# Patient Record
Sex: Female | Born: 1960 | Race: White | Hispanic: No | Marital: Married | Smoking: Never smoker
Health system: Southern US, Community
[De-identification: ages and names within clinical notes are randomized; demographics above are authoritative.]

## PROBLEM LIST (undated history)

## (undated) DIAGNOSIS — Z923 Personal history of irradiation: Secondary | ICD-10-CM

## (undated) DIAGNOSIS — F32A Depression, unspecified: Secondary | ICD-10-CM

## (undated) DIAGNOSIS — Z9889 Other specified postprocedural states: Secondary | ICD-10-CM

## (undated) DIAGNOSIS — F419 Anxiety disorder, unspecified: Secondary | ICD-10-CM

## (undated) DIAGNOSIS — F329 Major depressive disorder, single episode, unspecified: Secondary | ICD-10-CM

## (undated) DIAGNOSIS — E7800 Pure hypercholesterolemia, unspecified: Secondary | ICD-10-CM

## (undated) DIAGNOSIS — T7840XA Allergy, unspecified, initial encounter: Secondary | ICD-10-CM

## (undated) DIAGNOSIS — I10 Essential (primary) hypertension: Secondary | ICD-10-CM

## (undated) HISTORY — DX: Pure hypercholesterolemia, unspecified: E78.00

## (undated) HISTORY — PX: BREAST LUMPECTOMY: SHX2

## (undated) HISTORY — DX: Allergy, unspecified, initial encounter: T78.40XA

## (undated) HISTORY — PX: REPAIR PERONEAL TENDONS ANKLE: SUR1201

## (undated) HISTORY — DX: Anxiety disorder, unspecified: F41.9

---

## 1999-06-29 ENCOUNTER — Other Ambulatory Visit: Admission: RE | Admit: 1999-06-29 | Discharge: 1999-06-29 | Payer: Self-pay | Admitting: Obstetrics and Gynecology

## 2000-05-28 HISTORY — PX: BREAST BIOPSY: SHX20

## 2000-05-28 HISTORY — PX: BREAST SURGERY: SHX581

## 2000-07-22 ENCOUNTER — Other Ambulatory Visit: Admission: RE | Admit: 2000-07-22 | Discharge: 2000-07-22 | Payer: Self-pay | Admitting: Obstetrics and Gynecology

## 2000-08-14 ENCOUNTER — Encounter: Admission: RE | Admit: 2000-08-14 | Discharge: 2000-08-14 | Payer: Self-pay | Admitting: General Surgery

## 2000-08-14 ENCOUNTER — Encounter: Payer: Self-pay | Admitting: General Surgery

## 2000-08-28 ENCOUNTER — Encounter: Payer: Self-pay | Admitting: General Surgery

## 2000-08-28 ENCOUNTER — Encounter: Admission: RE | Admit: 2000-08-28 | Discharge: 2000-08-28 | Payer: Self-pay | Admitting: General Surgery

## 2000-09-06 ENCOUNTER — Encounter (INDEPENDENT_AMBULATORY_CARE_PROVIDER_SITE_OTHER): Payer: Self-pay | Admitting: Specialist

## 2000-09-06 ENCOUNTER — Ambulatory Visit (HOSPITAL_COMMUNITY): Admission: RE | Admit: 2000-09-06 | Discharge: 2000-09-06 | Payer: Self-pay | Admitting: General Surgery

## 2000-12-21 ENCOUNTER — Encounter: Admission: RE | Admit: 2000-12-21 | Discharge: 2000-12-21 | Payer: Self-pay | Admitting: Podiatry

## 2000-12-27 ENCOUNTER — Observation Stay (HOSPITAL_COMMUNITY): Admission: RE | Admit: 2000-12-27 | Discharge: 2000-12-28 | Payer: Self-pay | Admitting: Specialist

## 2001-07-29 ENCOUNTER — Other Ambulatory Visit: Admission: RE | Admit: 2001-07-29 | Discharge: 2001-07-29 | Payer: Self-pay | Admitting: Obstetrics and Gynecology

## 2002-08-03 ENCOUNTER — Other Ambulatory Visit: Admission: RE | Admit: 2002-08-03 | Discharge: 2002-08-03 | Payer: Self-pay | Admitting: Obstetrics and Gynecology

## 2003-09-28 ENCOUNTER — Other Ambulatory Visit: Admission: RE | Admit: 2003-09-28 | Discharge: 2003-09-28 | Payer: Self-pay | Admitting: Internal Medicine

## 2004-10-19 ENCOUNTER — Other Ambulatory Visit: Admission: RE | Admit: 2004-10-19 | Discharge: 2004-10-19 | Payer: Self-pay | Admitting: Internal Medicine

## 2004-12-18 ENCOUNTER — Other Ambulatory Visit: Admission: RE | Admit: 2004-12-18 | Discharge: 2004-12-18 | Payer: Self-pay | Admitting: Obstetrics and Gynecology

## 2005-12-28 ENCOUNTER — Ambulatory Visit (HOSPITAL_COMMUNITY): Admission: RE | Admit: 2005-12-28 | Discharge: 2005-12-29 | Payer: Self-pay | Admitting: Specialist

## 2008-04-15 ENCOUNTER — Inpatient Hospital Stay (HOSPITAL_COMMUNITY): Admission: RE | Admit: 2008-04-15 | Discharge: 2008-04-17 | Payer: Self-pay | Admitting: Obstetrics and Gynecology

## 2008-04-15 ENCOUNTER — Encounter (INDEPENDENT_AMBULATORY_CARE_PROVIDER_SITE_OTHER): Payer: Self-pay | Admitting: Obstetrics and Gynecology

## 2009-07-07 ENCOUNTER — Ambulatory Visit: Payer: Self-pay | Admitting: Internal Medicine

## 2009-08-05 ENCOUNTER — Ambulatory Visit: Payer: Self-pay | Admitting: Internal Medicine

## 2009-08-17 ENCOUNTER — Ambulatory Visit: Payer: Self-pay | Admitting: Internal Medicine

## 2009-08-24 ENCOUNTER — Ambulatory Visit: Payer: Self-pay | Admitting: Internal Medicine

## 2010-03-28 HISTORY — PX: ABDOMINAL HYSTERECTOMY: SHX81

## 2010-10-10 NOTE — H&P (Signed)
NAME:  Terri Montes, Terri Montes                 ACCOUNT NO.:  [id number]   MEDICAL RECORD NO.:  [id number]          PATIENT TYPE:  AMB   LOCATION:  SDC                           FACILITY:  [facility]   PHYSICIAN:  Michelle L. Grewal, M.D.DATE OF BIRTH:  08-17-1960   DATE OF ADMISSION:  DATE OF DISCHARGE:                              HISTORY & PHYSICAL   DATE OF SURGERY:  April 15, 2008, at [facility] at 7:30 in the  morning.   HISTORY OF PRESENT ILLNESS:  This is a 50 year old patient, LMP on  March 17, 2008, presents for TAH.  She has been on the Yasmin for some  time.  For the past 3-4 months, she has had horrible periods.  She wakes  up at night soaking through her clothes.  She is passing chunks of  tissue.  She  floods through her sanitary products, and she has ruined  many clothes.  She feels extremely fatigued.  She wants definitive  treatment of this, though she has been on birth control for some times.  Ultrasound was performed and showed a 80-mm fibroid abutting endometrial  stripe.  It appears to be completely intramural and submucosal, and it  goes all the way through the wall of the uterus.  On exam, her cervix is  deep in the vagina and uterus is enlarged.   MEDICAL HISTORY:  She has the history of headaches.  History of  surgeries, C-section in 1990 and vaginal delivery in 1993.   MEDICATIONS:  Yasmin.   ALLERGIES:  She has no known allergies.   FAMILY HISTORY:  Unremarkable.   SOCIAL HISTORY:  She denies any tobacco or alcohol.   REVIEW OF SYSTEMS:  Unremarkable.   PHYSICAL EXAMINATION:  VITAL SIGNS:  Height 5 feet and 5 inches, weight  218.4, and BP 136/72.  LUNGS:  Clear to auscultation bilaterally.  CARDIAC:  Regular rate and rhythm.  BREASTS:  Soft, nontender, and no masses.  PELVIC:  External genitalia.  Vulva and vagina appeared normal.  Cervix  is deep in the vagina.  Uterus is slightly enlarged.   IMPRESSION:  Menorrhagia and fibroids.   PLAN:  We will  proceed with TAH and possible BSO.  Risk of procedure  discussed with the patient which include risk of anesthesia, risk of  infection, risk of blood clot, and risk of injuries in surrounding  organs.  Postoperative care was discussed with the patient.  We will  proceed with the surgery.      Michelle L. Vincente Poli, M.D.  Electronically Signed     MLG/MEDQ  D:  04/09/2008  T:  04/10/2008  Job:  [id number]

## 2010-10-10 NOTE — Op Note (Signed)
NAME:  Terri Montes, Terri Montes                 ACCOUNT NO.:  [id number]   MEDICAL RECORD NO.:  [id number]          PATIENT TYPE:  AMB   LOCATION:  SDC                           FACILITY:  [facility]   PHYSICIAN:  Michelle L. Grewal, M.D.DATE OF BIRTH:  1961/03/14   DATE OF PROCEDURE:  04/15/2008  DATE OF DISCHARGE:                               OPERATIVE REPORT   PREOPERATIVE DIAGNOSIS:  Symptomatic fibroids and menorrhagia.   POSTOPERATIVE DIAGNOSIS:  Symptomatic fibroids and menorrhagia.   PROCEDURE:  Total abdominal hysterectomy.   SURGEON:  Michelle L. Vincente Poli, M.D.   ASSISTANT:  Zelphia Cairo, M.D.   ANESTHESIA:  General.   FINDINGS:  Multiple fibroids.   PROCEDURE:  Patient taken to the operating room where she was intubated  without difficulty.  She is prepped and draped in usual sterile fashion.  A Foley catheter was inserted and draining clear urine.  A low  transverse incision was made at the area of previous surgical scar, was  carried down to the fascia, fascia scored in the midline, extended  laterally.  The rectus muscles were separated in midline.  The  peritoneum was entered bluntly.  The peritoneal incision was then  stretched.  The self-retaining retractors was placed in the abdominal  cavity.  Large and small bowel were placed in the upper abdomen with  laparotomy pads.  The uterus was identified.  The ovaries appeared  normal.  The uterus was enlarged and had multiple fibroids.  Kelly  clamps were used and placed across the triple pedicle on either side and  the specimen was elevated.  The round ligament was identified on the  right side.  It was suture-ligated using 0 Vicryl suture and was  cauterized and then the anterior and posterior leaves of the broad  ligament were developed.  The triple pedicle was identified and  avascular window was created beneath it and a curved Heaney clamp was  placed across the triple pedicle.  It was suture ligated using 0 Vicryl  suture  and a free tie of 0 Vicryl suture.  This was done identically on  the left side.  The bladder flap was created without any difficulty and  the uterine artery was identified on the right side.  A curved Heaney  clamp was placed across the uterine artery at the level of the internal  os.  This was done on the right side and then on the left side and each  pedicle was suture ligated using 0 Vicryl suture.  We then were able to  elevate the uterus even more and then developed the bladder flap even  more using Metzenbaum scissors.  The straight Heaney clamps were placed  just snug beside the cervix, initially on the right and then on the  left.  Each pedicle was clamped, suture ligated using 0 Vicryl suture.  Once we reached the level of the external os, curved Heaney clamps were  placed just beneath the external os, staying snug beside the cervix and  the specimen was removed. The angle stitches were placed using 0 Vicryl  suture.  The  remainder of the cuff was closed using three figure-of-  eights using 0 Vicryl suture.  Irrigation was performed.  No bleeding  was noted.  Suture was cut and the retractor and the laparotomy pads  were removed from the abdominal cavity.  The peritoneum was closed using  0 Vicryl running stitch.  The rectus muscles were reapproximated using  the same 0 Vicryl.  The fascia was closed using 0 Vicryl in a running  stitch.  After irrigation of subcutaneous layer,  the skin was closed with a 3-0 Vicryl subcuticular and the skin was  closed with Dermabond skin adhesive.  All sponge, lap and instrument  counts were correct x2.  The patient went to recovery room, extubated in  stable condition.  She will go up to the third floor for routine postop  care and stay for approximately two nights.      Michelle L. Vincente Poli, M.D.  Electronically Signed     MLG/MEDQ  D:  04/15/2008  T:  04/15/2008  Job:  [id number]

## 2010-10-13 NOTE — Discharge Summary (Signed)
Terri Montes, Terri Montes                 ACCOUNT NO.:  [id number]   MEDICAL RECORD NO.:  [id number]          PATIENT TYPE:  INP   LOCATION:  9309                          FACILITY:  [facility]   PHYSICIAN:  Michelle L. Grewal, M.D.DATE OF BIRTH:  12/04/1960   DATE OF ADMISSION:  04/15/2008  DATE OF DISCHARGE:  04/17/2008                               DISCHARGE SUMMARY   HOSPITAL COURSE:  The patient is a 51 year old patient who presents  today for a TAH.  She underwent surgery, EBL at the time of surgery was  200 mL.  She had a complicated postop course.  By postop day #2, she was  ambulating, tolerating regular diet, remained afebrile with stable vital  signs, and had good pain control.  She was discharged home in good  condition on postop day #2.  She was given ibuprofen 600 mg every 6  hours as needed for pain and Percocet 1-2 every 4-7 hours as needed for  pain.  She was advised to call if she has any temperature greater than  100.5, redness or drainage from incision site, severe abdominal pain,  nausea, or vomiting.  She was advised to she could shower daily, but she  could have no baths for 6 weeks and no intercourse for 6 weeks.  She was  advised no driving for 1-2 weeks.  She will follow up in the office in 1  week.      Michelle L. Vincente Poli, M.D.  Electronically Signed     MLG/MEDQ  D:  05/13/2008  T:  05/13/2008  Job:  [id number]

## 2010-10-13 NOTE — Op Note (Signed)
NAMECHASMINE, LENDER                 ACCOUNT NO.:  [id number]   MEDICAL RECORD NO.:  [id number]          PATIENT TYPE:  AMB   LOCATION:  DAY                          [facility]ACILITY:  [facility]   PHYSICIAN:  Jene Every, M.D.    DATE OF BIRTH:  12/12/1960   DATE OF PROCEDURE:  12/28/2005  DATE OF DISCHARGE:                                 OPERATIVE REPORT   PREOPERATIVE DIAGNOSIS:  Peroneal tendon tear, synovitis.   POSTOPERATIVE DIAGNOSIS:  Peroneal tendon tear, synovitis.   PROCEDURE PERFORMED:  1. Repair of peroneal brevis tendon, right ankle.  2. Incision and drainage and synovectomy of peroneal tendon sheath.  3. Repair of retinaculum, peroneal tendon.   ANESTHESIA:  General.   ASSISTANT:  Roma Schanz, P.A.   BRIEF HISTORY/INDICATIONS:  This is a 50 year old female with refractory  ankle pain and swelling.  MRI indicating a split tear of the peroneus  brevis.  She was indicated for a repair and debridement.  She had been  refractory to a couple of steroid injections.  The risks and benefits have  been discussed, including bleeding, infection, damage to vascular  structures, re-tear, persistent pain, etc.   TECHNIQUE:  With the patient in the supine position after the induction of  adequate general anesthesia and 1 gm of Kefzol, the right lower extremity  was prepped and draped and exsanguinated in the usual sterile fashion.  Thigh tourniquet inflated to 300 mmHg.  An incision was made over the  posterior aspect of the fibula, through the skin only.  The subcutaneous  tissue was bluntly dissected.  Self-retaining retractors were placed.  Identified the retinaculum behind the fibula.  There was extensive and  exuberant synovitis and clear synovial fluid that was evacuated and sent for  Gram's stain which was negative.  We protected the neurovascular structures  at all times.  Bipolar electrocautery was utilized to achieve hemostasis.  We divided the retinaculum approximately 2  mm from its bony insertion on the  fibula, opened it, and examined the canal.  There was hypertrophic  synovitis, which was debrided and irrigated.  A longitudinal split tear was  noted in the peroneus brevis tendon approximately 2 cm in length.  This was  repaired with a 6-0 nylon running suture and simple sutures on the top  __________ for good closure and repair.  I then irrigated it.  Felt that to  repair the retinaculum and to make this repair secure, we used two  biosorbable suture anchors into the fibula with FiberWire attached to them.  We drilled a hole in the fibula and then repaired pants-over-vest the  retinaculum with two of these suture anchors with excellent repair, and this  was over-sewed with 0 Vicryl simple sutures.  The wound was then copiously  irrigated.  I then repaired the subcutaneous  tissue with 2-0 Vicryl simple sutures.  The skin was reapproximated with 3-0  subcuticular Prolene.  The wound reinforced with Steri-Strips.  Sterile  dressing applied.  The tourniquet was deflated once adequate vascularization  of the lower extremity was appreciated.  The patient tolerated the  procedure  well.  No complications.      Jene Every, M.D.  Electronically Signed     JB/MEDQ  D:  12/28/2005  T:  12/28/2005  Job:  [id number]

## 2010-10-13 NOTE — Op Note (Signed)
[facility]  Patient:    Terri Montes, Terri Montes                        MRN: [id number] Proc. Date: 12/27/00 Adm. Date:  [id number] Disc. Date: [id number] Attending:  Pierce Crane                           Operative Report  PREOPERATIVE DIAGNOSES:  ______ tenosynovitis peroneal tendon sheath, tear of the peroneal tendon, draining sinus wound.  POSTOPERATIVE DIAGNOSES:  ______ tenosynovitis peroneal tendon sheath, tear of the peroneal tendon, draining sinus wound, contusion with fibrosis around the sural nerve.  PROCEDURE PERFORMED:  Excision of the sinus tract, left ankle.  Epineurolysis of the sural nerve, open I&D of the peroneal tendon sheath, irrigation and debridement, intraoperative cultures and Gram stain of periperoneal brevis tendon.  BRIEF HISTORY AND INDICATION:  A 50 year old who received a laceration of the lateral aspect of the ankle, bled for a few days and became infected with purulent drainage.  Placed on Keflex and improved.  MRI indicated tear of the peroneal tendon, significant fluid around the peroneal tendon.  She had a persistent draining sinus with erythema.  Operative intervention was indicated for open I&D, debridement, possible repair of the peroneal tendon.  The risks and benefits discussed including bleeding, infection, damage to vascular structures, no change in symptoms, fibrosis, persistent symptoms.  DESCRIPTION OF PROCEDURE:  The patient in the supine position.  After adequate general anesthesia, 1 gram of Kefzol, she was placed in the right lateral decubitus position.  Tourniquet inflated to 350 mmHg.  No exsanguination.  A curvilinear incision was based over the lateral aspect of the ankle in the inframalleolar region, through the skin only.  The subcutaneous tissue was bluntly dissected.  The small eschar with surrounding erythema and sinus tract was excised down to the peroneal tendon sheath.  The sural nerve  was identified and found to be erythematous, edematous, and scar tissue surrounding this.  An epineurolysis was performed.  The sural nerve was preserved.  The peroneal tendon sheath was partially ruptured.  The retinaculum over the peroneal tendon sheath was then divided and brown, serous fluid was then evacuated from the peroneal tendon sheath.  This area was copiously irrigated, and hypertrophic synovium was slightly debrided.  The majority of the synovium was left intact.  Next, the peroneus brevis tendon was inspected and evaluated, and there was a longitudinal tear about 1.5 cm in length.  Narrow and full thickness longitudinally.  It was repaired with 2-0 Vicryl interrupted figure-of-eight sutures.  The wound copiously irrigated. Allis placed on the tendon, distally and proximally.  It was continuous. Again, the wound copiously irrigated.  Drain was placed, and the peroneal tendon sheath brought out through a distal stab wound on the skin.  STAT intraoperative Gram stain indicated no evidence of organisms or white blood cells.  The retinaculum was then repaired with #1 Vicryl interrupted figure-of-eight sutures.  The subcutaneous tissue was reapproximated with 2-0 Vicryl simple sutures.  The skin was reapproximated with 4-0 subcuticular Prolene and the wound reinforced with Steri-Strips.  Again, the sural nerve was placed in the fatty tissues.  The wound was dressed sterilely, secured with an ACE bandage.  Tourniquet was deflated, and there was adequate vascularization of the lower extremity appreciated.  The patient tolerated the procedure well without complications. DD:  12/27/00 TD:  12/30/00 Job: [id number]

## 2010-10-13 NOTE — Op Note (Signed)
[facility]  Patient:    Terri Montes, Terri Montes                        MRN: [id number] Proc. Date: 09/06/00 Adm. Date:  [id number] Attending:  Carson Myrtle CC:         Marcelle Overlie, M.D.   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  PROCEDURE:  Excision mass, left breast.  SURGEON:  Dr. Kendrick Ranch.  ANESTHESIA:  Local standby.  INDICATIONS FOR PROCEDURE:  Ms. Daddona has been seen and consulted in the office for a clear to bloody discharge in the left nipple. A ductogram revealed a probable papilloma and she is anxious and ready to proceed with surgical excision. She agrees and understands the procedure, its expected results and its potential complications.  DESCRIPTION OF PROCEDURE:  The patient is brought to the operating room, placed supine, IV started and sedation given. The left breast was prepped and draped in the usual fashion. The smallest of the tear duct probes was used and after gentle massage of the left breast, bloody discharge ensued from the center of the nipple complex. The tear duct probe was used to isolate this duct and it descended for about 2.5 cm into the breast. This was held in place and then local anesthesia was provided around the medial aspect of the nipple areolar complex. A curvilinear incision was made at the junction of the skin and areolar and I undermined the areolar, isolated the tear duct probe and its ductal structure and then sharply excised this from just under the nipple to deep in the breast. This was removed in four small segments, these were carefully laid out, positioned, placed on a towel, marked and sent to pathology. Bleeding was controlled with the cautery. Careful bimanual palpation revealed no other masses or areas of concern. The bloody discharge stopped. The tear duct probe was removed. The breast was  reapproximated with 3-0 Vicryl and the skin was closed with 4-0 monocryl. Counts were correct, Steri-Strips applied, she tolerated it well and was removed to the recovery room in good condition.  Written and verbal instructions were given including Vicodin #24, refill one. She is to call for results Monday and return to the office. DD:  09/06/00 TD:  09/06/00 Job: [id number]

## 2011-01-22 ENCOUNTER — Telehealth: Payer: Self-pay | Admitting: Internal Medicine

## 2011-01-22 DIAGNOSIS — Z8669 Personal history of other diseases of the nervous system and sense organs: Secondary | ICD-10-CM

## 2011-01-22 NOTE — Telephone Encounter (Signed)
Patient called this morning to say her husband took his life yesterday. One of her sons found him. Body was sent to medical examiner at [facility]. He had been under treatment for depression. Was very depressed about business situation. She asked if she had caused this to happen and I told her she was not at fault. She said he been going to [facility]. She says she has Xanax to take for anxiety. Says she has had a headache. Asking if she can take Excedrin and I told her it was fine to take Excedrin sparingly if it did not upset her stomach. Offered to see her if needed.

## 2011-02-27 LAB — CBC
HCT: 30 — ABNORMAL LOW
HCT: 38.2
Hemoglobin: 10.6 — ABNORMAL LOW
Hemoglobin: 13.3
MCHC: 34.7
MCHC: 35.5
MCV: 91.8
MCV: 92.8
Platelets: 231
Platelets: 291
RBC: 3.23 — ABNORMAL LOW
RBC: 4.16
RDW: 12.3
RDW: 12.3
WBC: 7.8
WBC: 9.5

## 2011-02-27 LAB — BASIC METABOLIC PANEL
BUN: 8
CO2: 23
Calcium: 8.6
Chloride: 97
Creatinine, Ser: 0.79
GFR calc Af Amer: >60
GFR calc non Af Amer: >60
Glucose, Bld: 153 — ABNORMAL HIGH
Potassium: 3.5
Sodium: 131 — ABNORMAL LOW

## 2011-10-31 ENCOUNTER — Other Ambulatory Visit: Payer: Self-pay | Admitting: Obstetrics and Gynecology

## 2012-07-15 ENCOUNTER — Ambulatory Visit (INDEPENDENT_AMBULATORY_CARE_PROVIDER_SITE_OTHER): Payer: 59 | Admitting: Internal Medicine

## 2012-07-15 ENCOUNTER — Encounter: Payer: Self-pay | Admitting: Internal Medicine

## 2012-07-15 VITALS — BP 158/98 | HR 96 | Temp 99.5°F | Ht 64.0 in | Wt 228.0 lb

## 2012-07-15 DIAGNOSIS — L0292 Furuncle, unspecified: Secondary | ICD-10-CM

## 2012-07-15 DIAGNOSIS — Z23 Encounter for immunization: Secondary | ICD-10-CM

## 2012-07-15 DIAGNOSIS — L02429 Furuncle of limb, unspecified: Secondary | ICD-10-CM

## 2012-07-15 DIAGNOSIS — L0293 Carbuncle, unspecified: Secondary | ICD-10-CM

## 2012-07-15 DIAGNOSIS — I10 Essential (primary) hypertension: Secondary | ICD-10-CM

## 2012-07-15 DIAGNOSIS — L02431 Carbuncle of right axilla: Secondary | ICD-10-CM

## 2012-07-15 MED ORDER — TETANUS-DIPHTH-ACELL PERTUSSIS 5-2.5-18.5 LF-MCG/0.5 IM SUSP: 0.5000 mL | Freq: Once | INTRAMUSCULAR | Status: DC

## 2012-07-15 NOTE — Patient Instructions (Addendum)
Appointment with Dr. Jamey Ripa 2:45 PM February 19. Begin doxycycline 100 mg twice daily for 10 days. Apply Bactroban ointment to nostrils at bedtime. Bathing instructions given. We have asked patient to have her son who is a paramedic check her BP at home, daily if possible and callus if it is elevated

## 2012-07-15 NOTE — Progress Notes (Signed)
  Subjective:    Patient ID: Terri Montes, female    DOB: 1960/09/14, 52 y.o.   MRN: [id number]  HPI 52 year old white female not seen since 2011 in today with carbuncle in right axilla onset a couple of days ago. Very painful and tender to touch. Says she had a similar lesion in her groin late fall 2013 which was treated by her GYN physician, Dr. Vincente Poli. She was told at that time she had a staph infection. She does not know if carbuncle in groin was cultured. There are no culture results in Epic from Fall 2013. She is afebrile. Taking Tylenol for pain with fairly good relief. She sensitive to pain medication.  She is a widow. 2 sons. Sons reside at home with her.  History of migraine headaches and hypertension.  Intolerant to Monistat, sulfa, Imitrex  C-section December 1995. Knee surgery 1987. 2 pregnancies no miscarriages.  Does not smoke or consume alcohol. 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 Two-year college degree.  Family history: Mother with history of hypertension and goiter    Review of Systems     Objective:   Physical Exam tender indurated erythematous lesion right axilla approximately 3 cm in diameter that is not draining        Assessment & Plan:  Carbuncle right axilla  History of previous carbuncle in groin-- Fall 2013  Suspect MRSA infection  Hypertension-blood pressure elevated today I think secondary to stress and pain. Will need followup.  Plan: Doxycycline 100 mg by mouth twice a day for 10 days. Purchase Hibiclens soap and begin bathing from neck down allowing Hibiclens to stay on 5 minutes before rinsing off for 3 months. This be followed by bathing with Dial soap with washcloth. Apply Bactroban ointment to nostrils hs.  Appointment with Dr. Jamey Ripa Wednesday, February 19 for incision and drainage.  Tetanus immunization update given.

## 2012-07-16 ENCOUNTER — Encounter (INDEPENDENT_AMBULATORY_CARE_PROVIDER_SITE_OTHER): Payer: Self-pay | Admitting: Surgery

## 2012-07-16 ENCOUNTER — Ambulatory Visit (INDEPENDENT_AMBULATORY_CARE_PROVIDER_SITE_OTHER): Payer: 59 | Admitting: Surgery

## 2012-07-16 VITALS — BP 158/86 | HR 101 | Temp 97.6°F | Ht 64.0 in | Wt 228.0 lb

## 2012-07-16 DIAGNOSIS — IMO0002 Reserved for concepts with insufficient information to code with codable children: Secondary | ICD-10-CM

## 2012-07-16 DIAGNOSIS — L02411 Cutaneous abscess of right axilla: Secondary | ICD-10-CM

## 2012-07-16 NOTE — Progress Notes (Signed)
CC:  Chief Complaint  Patient presents with  . Abscess    Right axilla    HPI: She comes to the urgent office ot the request of Dr Lenord Fellers for evaluation of a right axillary abscess. This has been developing for about three days, not better since starting doxy yesterday. Had a similar abscess in groin last year drained by Dr Vincente Poli and told it was staph.  PFSH _ In Epic and reviewed  EXAM:  VS; BP 158/86  Pulse 101  Temp(Src) 97.6 F (36.4 C) (Temporal)  Ht 5\' 4"  (1.626 m)  Wt 228 lb (103.42 kg)  BMI 39.12 kg/m2  SpO2 96% Gen: Alert, NAD Axilla: Fluctuant areas in anterior right axilla with erythema extending more medially. Appears superficial  IMP: Acute axillary abscess PLAN: I&. patient agrees.  Procedure Note: The area is anesthetized with 1% xylo with Epi. An I&D done sand a small amount of purulent material drained. Packed with Iodoform.  She will continue antibiotics, and see [place] in 10 days, but sooner if not better

## 2012-07-16 NOTE — Addendum Note (Signed)
Addended byLiliana Cline on: 07/16/2012 04:07 PM   Modules accepted: Orders

## 2012-07-16 NOTE — Patient Instructions (Addendum)
You may remove the dressing in two days and remove the pack. Then shower and keep a sterile dressing on.  Se me in about 10 days, sooner if this does not improve  MRSA Overview MRSA stands for methicillin-resistant Staphylococcus aureus. It is a type of bacteria that is resistant to some common antibiotics. It can cause infections in the skin and many other places in the body. Staphylococcus aureus, often called "staph," is a bacteria that normally lives on the skin or in the nose. Staph on the surface of the skin or in the nose does not cause problems. However, if the staph enters the body through a cut, wound, or break in the skin, an infection can happen. Up until recently, infections with the MRSA type of staph mainly occurred in hospitals and other healthcare settings. There are now increasing problems with MRSA infections in the community as well. Infections with MRSA may be very serious or even life-threatening. Most MRSA infections are acquired in one of two ways:  Healthcare-associated MRSA (HA-MRSA)  This can be acquired by people in any healthcare setting. MRSA can be a big problem for hospitalized people, people in nursing homes, people in rehabilitation facilities, people with weakened immune systems, dialysis patients, and those who have had surgery.  Community-associated MRSA (CA-MRSA)  Community spread of MRSA is becoming more common. It is known to spread in crowded settings, in jails and prisons, and in situations where there is close skin-to-skin contact, such as during sporting events or in locker rooms. MRSA can be spread through shared items, such as children's toys, razors, towels, or sports equipment. CAUSES  All staph, including MRSA, are normally harmless unless they enter the body through a scratch, cut, or wound, such as with surgery. All staph, including MRSA, can be spread from person-to-person by touching contaminated objects or through direct contact. SPECIAL  GROUPS MRSA can present problems for special groups of people. Some of these groups include:  Breastfeeding women.  The most common problem is MRSA infection of the breast (mastitis). There is evidence that MRSA can be passed to an infant from infected breast milk. Your caregiver may recommend that you stop breastfeeding until the mastitis is under control.  If you are breastfeeding and have a MRSA infection in a place other than the breast, you may usually continue breastfeeding while under treatment. If taking antibiotics, ask your caregiver if it is safe to continue breastfeeding while taking your prescribed medicines.  Neonates (babies from birth to 84 month old) and infants (babies from 74 month to 71 year old).  There is evidence that MRSA can be passed to a newborn at birth if the mother has MRSA on the skin, in or around the birth canal, or an infection in the uterus, cervix, or vagina. MRSA infection can have the same appearance as a normal newborn or infant rash or several other skin infections. This can make it hard to diagnose MRSA.  Immune compromised people.  If you have an immune system problem, you may have a higher chance of developing a MRSA infection.  People after any type of surgery.  Staph in general, including MRSA, is the most common cause of infections occurring at the site of recent surgery.  People on long-term steroid medicines.  These kinds of medicines can lower your resistance to infection. This can increase your chance of getting MRSA.  People who have had frequent hospitalizations, live in nursing homes or other residential care facilities, have venous or urinary  catheters, or have taken multiple courses of antibiotic therapy for any reason. DIAGNOSIS  Diagnosis of MRSA is done by cultures of fluid samples that may come from:  Swabs taken from cuts or wounds in infected areas.  Nasal swabs.  Saliva or deep cough specimens from the lungs  (sputum).  Urine.  Blood. Many people are "colonized" with MRSA but have no signs of infection. This means that people carry the MRSA germ on their skin or in their nose and may never develop MRSA infection.  TREATMENT  Treatment varies and is based on how serious, how deep, or how extensive the infection is. For example:  Some skin infections, such as a small boil or abscess, may be treated by draining yellowish-white fluid (pus) from the site of the infection.  Deeper or more widespread soft tissue infections are usually treated with surgery to drain pus and with antibiotic medicine given by vein or by mouth. This may be recommended even if you are pregnant.  Serious infections may require a hospital stay. If antibiotics are given, they may be needed for several weeks. PREVENTION  Because many people are colonized with staph, including MRSA, preventing the spread of the bacteria from person-to-person is most important. The best way to prevent the spread of bacteria and other germs is through proper hand washing or by using alcohol-based hand disinfectants. The following are other ways to help prevent MRSA infection within the hospital and community settings.   Healthcare settings:  Strict hand washing or hand disinfection procedures need to be followed before and after touching every patient.  Patients infected with MRSA are placed in isolation to prevent the spread of the bacteria.  Healthcare workers need to wear disposable gowns and gloves when touching or caring for patients infected with MRSA. Visitors may also be asked to wear a gown and gloves.  Hospital surfaces need to be disinfected frequently.  Community settings:  NIKE frequently with soap and water for at least 15 seconds. Otherwise, use alcohol-based hand disinfectants when soap and water is not available.  Make sure people who live with you wash their hands often, too.  Do not share personal items. For  example, avoid sharing razors and other personal hygiene items, towels, clothing, and athletic equipment.  Wash and dry your clothes and bedding at the warmest temperatures recommended on the labels.  Keep wounds covered. Pus from infected sores may contain MRSA and other bacteria. Keep cuts and abrasions clean and covered with germ-free (sterile), dry bandages until they are healed.  If you have a wound that appears infected, ask your caregiver if a culture for MRSA and other bacteria should be done.  If you are breastfeeding, talk to your caregiver about MRSA. You may be asked to temporarily stop breastfeeding. HOME CARE INSTRUCTIONS   Take your antibiotics as directed. Finish them even if you start to feel better.  Avoid close contact with those around you as much as possible. Do not use towels, razors, toothbrushes, bedding, or other items that will be used by others.  To fight the infection, follow your caregiver's instructions for wound care. Wash your hands before and after changing your bandages.  If you have an intravascular device, such as a catheter, make sure you know how to care for it.  Be sure to tell any healthcare providers that you have MRSA so they are aware of your infection. SEEK IMMEDIATE MEDICAL CARE IF:   The infection appears to be getting worse. Signs  include:  Increased warmth, redness, or tenderness around the wound site.  A red line that extends from the infection site.  A dark color in the area around the infection.  Wound drainage that is tan, yellow, or green.  A bad smell coming from the wound.  You feel sick to your stomach (nauseous) and throw up (vomit) or cannot keep medicine down.  You have a fever.  Your baby is older than 3 months with a rectal temperature of 102 F (38.9 C) or higher.  Your baby is 39 months old or younger with a rectal temperature of 100.4 F (38 C) or higher.  You have difficulty breathing. MAKE SURE YOU:    Understand these instructions.  Will watch your condition.  Will get help right away if you are not doing well or get worse. Document Released: 05/14/2005 Document Revised: 08/06/2011 Document Reviewed: 08/16/2010 [facility] Patient Information 2013 [place], [place].

## 2012-07-18 ENCOUNTER — Telehealth (INDEPENDENT_AMBULATORY_CARE_PROVIDER_SITE_OTHER): Payer: Self-pay | Admitting: General Surgery

## 2012-07-18 NOTE — Telephone Encounter (Signed)
Pt called to go over removing the packing from her wound and showering.  She has abscess I&D in Urgent office on 07/16/12, packed with iodaform gauze.  Her PCP has her showering with Hibiclens over her whole body, then using Dial soap (prevention/ treatment for MRSA.)  She is taking Doxycycline and using nasal swab ointment.  Drainage is still purulent and has foul odor, but slowly improving.  Reassured pt this is all expected and with history of MRSA, this it the preferred treatment.  Appt made with Urgent Office to recheck at 10 day mark, as Dr. Jamey Ripa is DOW that week and not available.  Pt will cancel appt if wound is healing well.

## 2012-07-19 LAB — WOUND CULTURE

## 2012-07-24 ENCOUNTER — Ambulatory Visit (INDEPENDENT_AMBULATORY_CARE_PROVIDER_SITE_OTHER): Payer: 59 | Admitting: Internal Medicine

## 2012-07-24 ENCOUNTER — Encounter: Payer: Self-pay | Admitting: Internal Medicine

## 2012-07-24 ENCOUNTER — Telehealth: Payer: Self-pay | Admitting: Internal Medicine

## 2012-07-24 VITALS — BP 178/110 | HR 84 | Temp 99.0°F | Wt 228.0 lb

## 2012-07-24 DIAGNOSIS — L02429 Furuncle of limb, unspecified: Secondary | ICD-10-CM

## 2012-07-24 DIAGNOSIS — I10 Essential (primary) hypertension: Secondary | ICD-10-CM

## 2012-07-24 DIAGNOSIS — L02439 Carbuncle of limb, unspecified: Secondary | ICD-10-CM

## 2012-07-24 DIAGNOSIS — R112 Nausea with vomiting, unspecified: Secondary | ICD-10-CM

## 2012-07-24 DIAGNOSIS — R319 Hematuria, unspecified: Secondary | ICD-10-CM

## 2012-07-24 LAB — T4, FREE: Free T4: 1.03 ng/dL (ref 0.80–1.80)

## 2012-07-24 LAB — TSH: TSH: 1.432 u[IU]/mL (ref 0.350–4.500)

## 2012-07-24 LAB — COMPREHENSIVE METABOLIC PANEL
Alkaline Phosphatase: 100 U/L (ref 39–117)
CO2: 29 mEq/L (ref 19–32)
Creat: 0.74 mg/dL (ref 0.50–1.10)
Glucose, Bld: 80 mg/dL (ref 70–99)
Total Bilirubin: 0.5 mg/dL (ref 0.3–1.2)

## 2012-07-24 LAB — CBC WITH DIFFERENTIAL/PLATELET
Basophils Absolute: 0.1 10*3/uL (ref 0.0–0.1)
HCT: 42.3 % (ref 36.0–46.0)
Hemoglobin: 14.6 g/dL (ref 12.0–15.0)
Lymphocytes Relative: 26 % (ref 12–46)
Monocytes Absolute: 1 10*3/uL (ref 0.1–1.0)
Monocytes Relative: 9 % (ref 3–12)
Neutro Abs: 7.4 10*3/uL (ref 1.7–7.7)
WBC: 11.8 10*3/uL — ABNORMAL HIGH (ref 4.0–10.5)

## 2012-07-24 NOTE — Progress Notes (Signed)
  Subjective:    Patient ID: Terri Montes, female    DOB: December 23, 1960, 52 y.o.   MRN: [id number]  HPI Patient has prior history of hypertension but says what she lost weight blood pressure normalized and she discontinued diuretic. She was here February 18 with carbuncle right axilla. Was referred to general surgeon the following day for incision and drainage. Says it's now draining serosanguineous fluid. Culture grew staph aureus (not more so) sensitive to Keflex and doxycycline. She was treated with doxycycline for 10 days. Today had onset of nausea and vomiting this morning. Has not felt well. Has had a headache. No diarrhea. Has felt chilly but no shaking chills. Is about to finish ten-day course of doxycycline tomorrow. Has appointment see surgeon tomorrow.  Blood pressure was elevated on February 18 at 158/98. Patient was advised to keep track of blood pressure at home. One of her sons is a paramedic and she's been checking it. It's been persistently elevated since being seen here on February 18. She is concerned about it in tearful in the office today but is not sure why this has happened. There is a family history of hypertension in her mother. Used to be on Maxzide 75/50 for hypertension. Patient says migraines disappeared when she had hysterectomy. Maxzide was started in February 2011. At that time blood pressure was 150/98. Hysterectomy without oophorectomy was done November 2010.    Review of Systems     Objective:   Physical Exam Funduscopic exam shows the disks to be sharp and flat bilaterally. No hemorrhages. Pharynx and TMs are clear. Neck is supple without JVD thyromegaly or carotid bruits. Chest clear to auscultation. Cardiac exam: regular rate and rhythm normal S1 and S2. Extremities without edema. Neuro: no gross focal deficits on brief neurological exam.  Right axilla: shows no purulent drainage at this point in time and incision still present.        Assessment & Plan:   Hypertension-thought to be benign essential hypertension. Family history of hypertension in mother. Start Norvasc 5 mg daily and return in 4 days.  Anxiety-clearly worried about her health. Asking if she has bacterial endocarditis. Patient reassured.  Carbuncle right axilla- status post incision and drainage treated with doxycycline  Nausea-? Related to doxycycline versus viral syndrome. Prescribed Phenergan 25 mg tablets one every 4 hours as needed for nausea  Hematuria-urine dipstick shows occult blood. Urine sent for microscopic evaluation to [facility] lab  Plan: Patient had nonfasting lab work drawn today including CBC and complete metabolic panel free T4 and TSH . Started on Norvasc 5 mg daily. Take first dose today upon arriving home. Start taking Norvasc every morning starting tomorrow. Return on Monday, March 3 for followup.  Time spent with patient 30 minutes

## 2012-07-24 NOTE — Telephone Encounter (Signed)
Patient given appt for today, 2/27 @ 1600.

## 2012-07-24 NOTE — Telephone Encounter (Signed)
I do not think BP is rising because of antibiotic. See her at 4pm today.

## 2012-07-24 NOTE — Patient Instructions (Addendum)
Start Norvasc 5 mg daily. Take Phenergan 25 mg every 4 hours as needed for nausea. Stop doxycycline. Return on Monday, March 3 .

## 2012-07-25 ENCOUNTER — Encounter (INDEPENDENT_AMBULATORY_CARE_PROVIDER_SITE_OTHER): Payer: 59 | Admitting: Surgery

## 2012-07-25 LAB — URINALYSIS, ROUTINE W REFLEX MICROSCOPIC
Leukocytes, UA: NEGATIVE
Nitrite: NEGATIVE
Specific Gravity, Urine: 1.016 (ref 1.005–1.030)
Urobilinogen, UA: 0.2 mg/dL (ref 0.0–1.0)
pH: 7 (ref 5.0–8.0)

## 2012-07-25 LAB — URINALYSIS, MICROSCOPIC ONLY

## 2012-07-26 LAB — URINE CULTURE
Colony Count: NO GROWTH
Organism ID, Bacteria: NO GROWTH

## 2012-07-28 ENCOUNTER — Encounter (INDEPENDENT_AMBULATORY_CARE_PROVIDER_SITE_OTHER): Payer: 59 | Admitting: General Surgery

## 2012-07-28 ENCOUNTER — Encounter: Payer: Self-pay | Admitting: Internal Medicine

## 2012-07-28 ENCOUNTER — Ambulatory Visit (INDEPENDENT_AMBULATORY_CARE_PROVIDER_SITE_OTHER): Payer: 59 | Admitting: Internal Medicine

## 2012-07-28 VITALS — BP 152/90 | HR 88 | Wt 226.0 lb

## 2012-07-28 DIAGNOSIS — L02439 Carbuncle of limb, unspecified: Secondary | ICD-10-CM

## 2012-07-28 DIAGNOSIS — F411 Generalized anxiety disorder: Secondary | ICD-10-CM

## 2012-07-28 DIAGNOSIS — I10 Essential (primary) hypertension: Secondary | ICD-10-CM

## 2012-07-28 DIAGNOSIS — L02429 Furuncle of limb, unspecified: Secondary | ICD-10-CM

## 2012-07-28 DIAGNOSIS — R609 Edema, unspecified: Secondary | ICD-10-CM

## 2012-07-29 NOTE — Progress Notes (Signed)
  Subjective:    Patient ID: Terri Montes, female    DOB: 06-Sep-1960, 52 y.o.   MRN: [id number]  HPI 52 year old white female started on amlodipine 5 mg recently. Blood pressure has improved but is still not within normal limits. Patient previously took Maxzide 25 several years ago for hypertension but then lost weight and came off that medication. Patient lost her husband to suicide. She still living in their home. She has 2 sons who still live at home. Husband's in-laws live close by. This relationship has been a bit of an issue. She sees in-laws as being inquisitive about her life. 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 Patient says she is owed money by father-in-law. She is in a relationship now with a new man and that is disturbing to the in-laws. She feels that they blame her for husband's death. Husband was depressed over family business. Patient says she barred money from her 401-K to keep his business going and loaned money to business by credit card. At one point she was afraid she would lose her home due to the family business being in debt. 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 She would like to move but doesn't feel she can do so at this time. Father-in-law is been over to her out buildings and has been taking some equipment out of the buildings.  Patient complaining of fluid retention. Unable to wear some shoes recently. This could be due to amlodipine although she was on a small dose.  Patient having to take Xanax more frequently recently due to stress but not to excess by any means.  Was to have followup appointment with surgeon on February 28. Unfortunately surgeon was called to an emergency and she was not able to see him. Carbuncle I&D area seems to be healing up in axilla without issue. Doxycycline as been discontinued because of nausea      Review of Systems     Objective:   Physical Exam chest clear to auscultation. Cardiac exam regular rate and rhythm. Extremities without pitting edema  Spent 30 minutes speaking with patient about all these issues at length. Still has significant grief reaction and guilt regarding husband's death      Assessment & Plan:  Fluid retention on amlodipine  Hypertension-improved but not at goal  Anxiety  Situational stress  Carbuncle with staph aureus (not MRSA)-resolving from axillary area  Plan: Add Maxzide 25 to amlodipine 5 mg daily and return in 10 days. Recommend psychologist for counseling. Refill Xanax 0.5 mg #90 one half to one by mouth tid a day when necessary anxiety. Previous prescription done by GYN physician Dr. Vincente Poli.

## 2012-07-29 NOTE — Patient Instructions (Addendum)
Add Maxide 25 to amlodipine 5 mg daily. Consider counseling with psychologist. Take Xanax sparingly. Return in 10 days

## 2012-08-11 ENCOUNTER — Ambulatory Visit: Payer: 59 | Admitting: Internal Medicine

## 2012-08-11 ENCOUNTER — Encounter: Payer: Self-pay | Admitting: Internal Medicine

## 2012-08-11 VITALS — BP 148/86 | HR 100 | Temp 99.1°F | Wt 226.0 lb

## 2012-08-11 DIAGNOSIS — F439 Reaction to severe stress, unspecified: Secondary | ICD-10-CM

## 2012-08-11 DIAGNOSIS — R609 Edema, unspecified: Secondary | ICD-10-CM

## 2012-08-11 DIAGNOSIS — F411 Generalized anxiety disorder: Secondary | ICD-10-CM

## 2012-08-11 DIAGNOSIS — I10 Essential (primary) hypertension: Secondary | ICD-10-CM

## 2012-08-16 DIAGNOSIS — F439 Reaction to severe stress, unspecified: Secondary | ICD-10-CM | POA: Insufficient documentation

## 2012-08-16 NOTE — Progress Notes (Signed)
  Subjective:    Patient ID: Terri Montes, female    DOB: November 11, 1960, 52 y.o.   MRN: [id number]  HPI Patient in today to followup on anxiety and hypertension. Last seen in 07/29/2012 after being started on amlodipine for hypertension. Was complaining of fluid retention and was started on Maxzide 25. Blood pressure has improved to 148/86 the umbilicus see further improvement. Says she and her sons had a family meeting and tried to work out some of their issues. Says this was of benefit. She is going to see counselor in the near future. Realizes it blood pressure is likely elevated because of significant situational stress.    Review of Systems     Objective:   Physical Exam spent 20 minutes talking with patient about these issues. Chest clear to auscultation. Cardiac exam regular rate and rhythm normal S1 and S2. Trace edema nonpitting. Affect is normal        Assessment & Plan:  Hypertension  Situational stress  Plan: Discontinue Maxzide 25. Change to Cozaar 50/12.5 daily. Continue amlodipine 5 mg daily. Return in 3 weeks. Will need b-met drawn then on losartan HCTZ

## 2012-08-16 NOTE — Patient Instructions (Addendum)
Discontinue Maxide 25. Take Cozaar 50/12.5 mg daily. Continue amlodipine 5 mg daily. Return in 3 weeks.

## 2012-08-18 ENCOUNTER — Other Ambulatory Visit: Payer: Self-pay | Admitting: Internal Medicine

## 2012-09-05 ENCOUNTER — Ambulatory Visit: Payer: Self-pay | Admitting: Internal Medicine

## 2012-09-11 ENCOUNTER — Ambulatory Visit (INDEPENDENT_AMBULATORY_CARE_PROVIDER_SITE_OTHER): Payer: 59 | Admitting: Internal Medicine

## 2012-09-11 ENCOUNTER — Encounter: Payer: Self-pay | Admitting: Internal Medicine

## 2012-09-11 VITALS — BP 124/86 | HR 92 | Temp 99.1°F | Wt 230.0 lb

## 2012-09-11 DIAGNOSIS — R609 Edema, unspecified: Secondary | ICD-10-CM

## 2012-09-11 DIAGNOSIS — I10 Essential (primary) hypertension: Secondary | ICD-10-CM

## 2012-09-11 LAB — BASIC METABOLIC PANEL
CO2: 30 mEq/L (ref 19–32)
Calcium: 10.2 mg/dL (ref 8.4–10.5)
Creat: 0.76 mg/dL (ref 0.50–1.10)
Sodium: 139 mEq/L (ref 135–145)

## 2012-09-15 ENCOUNTER — Other Ambulatory Visit: Payer: Self-pay | Admitting: Internal Medicine

## 2012-10-14 ENCOUNTER — Other Ambulatory Visit: Payer: Self-pay | Admitting: Internal Medicine

## 2012-11-21 ENCOUNTER — Telehealth: Payer: Self-pay | Admitting: Internal Medicine

## 2012-11-21 NOTE — Telephone Encounter (Signed)
Yes we can do this. Does she want Pap and pelvic only with next visit?

## 2012-11-25 NOTE — Telephone Encounter (Signed)
Spoke with patient.  She states it is time for her annual now.  She's scheduled for 6 mo f/u in October.  She instead wants or "needs" to be seen r/t feet swelling.  She is having it in both feet with residual swelling leftover in the morning when she gets up.  She has been on Losartan/HCTZ since late April and continues to take that.  Still the swelling persists.  Appointment given for 7/8 for this issue as well as med check on Pristiq.  Patient advised she'll talk with Dr. Lenord Fellers at that time about when to do the Pap/Pelvic.

## 2012-12-02 ENCOUNTER — Ambulatory Visit (INDEPENDENT_AMBULATORY_CARE_PROVIDER_SITE_OTHER): Payer: 59 | Admitting: Internal Medicine

## 2012-12-02 ENCOUNTER — Encounter: Payer: Self-pay | Admitting: Internal Medicine

## 2012-12-02 VITALS — BP 128/80 | Temp 99.3°F | Wt 226.0 lb

## 2012-12-02 DIAGNOSIS — I10 Essential (primary) hypertension: Secondary | ICD-10-CM

## 2012-12-02 DIAGNOSIS — R609 Edema, unspecified: Secondary | ICD-10-CM

## 2012-12-02 MED ORDER — DESVENLAFAXINE SUCCINATE ER 50 MG PO TB24: 50.0000 mg | ORAL_TABLET | Freq: Every day | ORAL | Status: DC

## 2012-12-07 ENCOUNTER — Encounter: Payer: Self-pay | Admitting: Internal Medicine

## 2012-12-07 NOTE — Progress Notes (Signed)
  Subjective:    Patient ID: Terri Montes, female    DOB: May 30, 1960, 52 y.o.   MRN: [id number]  HPI Blood pressure under good control on current regimen. She's only on amlodipine 5 mg daily. Feet and legs have been swelling quite a bit. Long-standing history of fluid retention. She is on losartan HCTZ in addition to amlodipine. Also wants to have Pap smear. However has had hysterectomy. Wants to get GYN care here rather than see GYN physician.    Review of Systems     Objective:   Physical Exam bimanual exam today is normal without masses. No Pap taken because of hysterectomy. She has 1+ pitting edema ankles and feet. Chest clear. Cardiac exam regular rate and rhythm normal S1 and S2. Neck is supple without JVD thyromegaly or carotid bruits.        Assessment & Plan:  Dependent edema  Hypertension  Normal bimanual exam  Plan: Lasix 20 mg tablets prescribed. Patient is to take 2 tablets daily for 5 days then one tablet daily. Discontinue losartan HCTZ. Instead, Take losartan 100 mg daily. Continue amlodipine 5 mg daily. Check b- met next 4 weeks on Lasix.

## 2012-12-07 NOTE — Patient Instructions (Addendum)
Stop losartan HCTZ. Take Lasix 20 mg 2 tablets daily for 5 days then one tablet daily. Take losartan 100 mg daily and amlodipine 5 mg daily. Return for b- met in the next 4 weeks

## 2013-01-06 ENCOUNTER — Other Ambulatory Visit: Payer: 59 | Admitting: Internal Medicine

## 2013-01-06 DIAGNOSIS — I10 Essential (primary) hypertension: Secondary | ICD-10-CM

## 2013-01-06 LAB — BASIC METABOLIC PANEL
CO2: 30 mEq/L (ref 19–32)
Chloride: 103 mEq/L (ref 96–112)
Creat: 0.84 mg/dL (ref 0.50–1.10)
Potassium: 3.8 mEq/L (ref 3.5–5.3)
Sodium: 140 mEq/L (ref 135–145)

## 2013-02-15 DIAGNOSIS — R609 Edema, unspecified: Secondary | ICD-10-CM | POA: Insufficient documentation

## 2013-02-15 NOTE — Patient Instructions (Addendum)
Increased losartan HCTZ to 100/25 daily. Call with progress report in 4 weeks

## 2013-02-15 NOTE — Progress Notes (Signed)
  Subjective:    Patient ID: Terri Montes, female    DOB: Aug 26, 1960, 52 y.o.   MRN: [id number]  HPI At last visit, Maxzide was discontinued patient was started on Hyzaar 50/12.5 mg daily in addition to amlodipine 5 mg daily for hypertension. She is here for followup of hypertension. Tolerating Hyzaar without any problems. Still with some edema. Brings in multiple blood pressure readings. Still showing some labile fluctuations. Stress seems to be better.    Review of Systems     Objective:   Physical Exam neck is supple without JVD. Chest clear to auscultation. Cardiac exam regular rate and rhythm. 1+ nonpitting edema Lorch strategies        Assessment & Plan:  Hypertension-blood pressure improving  Dependent edema-long-standing history. Diuretic needs to be increased  Plan: Increase Hyzaar to 100/25. Continue to monitor blood pressure and call with progress report in 4 weeks.

## 2013-02-26 ENCOUNTER — Other Ambulatory Visit: Payer: Self-pay | Admitting: Internal Medicine

## 2013-03-19 ENCOUNTER — Encounter: Payer: Self-pay | Admitting: Internal Medicine

## 2013-03-19 ENCOUNTER — Other Ambulatory Visit: Payer: Self-pay | Admitting: Internal Medicine

## 2013-03-19 ENCOUNTER — Ambulatory Visit (INDEPENDENT_AMBULATORY_CARE_PROVIDER_SITE_OTHER): Payer: 59 | Admitting: Internal Medicine

## 2013-03-19 DIAGNOSIS — I10 Essential (primary) hypertension: Secondary | ICD-10-CM

## 2013-03-19 DIAGNOSIS — H8110 Benign paroxysmal vertigo, unspecified ear: Secondary | ICD-10-CM

## 2013-03-19 DIAGNOSIS — R609 Edema, unspecified: Secondary | ICD-10-CM

## 2013-03-19 DIAGNOSIS — H6590 Unspecified nonsuppurative otitis media, unspecified ear: Secondary | ICD-10-CM

## 2013-03-19 MED ORDER — TORSEMIDE 20 MG PO TABS: ORAL_TABLET | ORAL | Status: DC

## 2013-03-19 MED ORDER — AZITHROMYCIN 250 MG PO TABS: ORAL_TABLET | ORAL | Status: DC

## 2013-03-19 NOTE — Patient Instructions (Signed)
Take Bonine for vertigo. Take Z-pak for URI if needed. Take Torsemide for leg swelling return in 2 weeks.

## 2013-03-25 ENCOUNTER — Other Ambulatory Visit: Payer: Self-pay | Admitting: Internal Medicine

## 2013-03-29 ENCOUNTER — Encounter: Payer: Self-pay | Admitting: Internal Medicine

## 2013-03-29 NOTE — Progress Notes (Addendum)
  Subjective:    Patient ID: Terri Montes, female    DOB: [birth year], 52 y.o.   MRN: [id number]  HPI  In today to followup on hypertension and dependent edema. Is now on Lasix, losartan 100 mg daily and amlodipine 5 mg daily. Just got back from trip to the beach with her boyfriend. Had a good time but feels a bit dizzy today. Notices dizziness with position change. Still having issues with dependent edema.    Review of Systems     Objective:   Physical Exam Skin is warm and dry. Nodes none. PERRLA. Funduscopic exam benign. No gross deficits on brief neurological exam. TMs are full bilaterally but not red. Pharynx is clear. Neck is supple. Chest clear to auscultation. Continues with 1+ pitting edema lower extremities. Cardiac exam regular rate and rhythm normal S1 and S2.       Assessment & Plan:  HTN-blood pressure well controlled on current regimen  Benign positional vertigo-likely related to otitis media  Dependent edema-discontinue amlodipine. Still having edema issues. Consider trying Bystolic 5 mg daily in addition to losartan if needed for blood pressure control. Switch from Lasix to Demadex 20-40 mg daily   Bilateral serous otitis media-treat with Zithromax Z-PAK.  Plan: May take Meclizine for vertigo if needed. Antibiotics for bilateral serous otitis media. Take Demadex 20-40 mg daily. Stop amlodipine. Return November 7 for followup and basic metabolic panel.

## 2013-04-03 ENCOUNTER — Encounter: Payer: Self-pay | Admitting: Internal Medicine

## 2013-04-03 ENCOUNTER — Ambulatory Visit (INDEPENDENT_AMBULATORY_CARE_PROVIDER_SITE_OTHER): Payer: 59 | Admitting: Internal Medicine

## 2013-04-03 VITALS — BP 128/74 | HR 80 | Temp 97.7°F | Ht 65.0 in | Wt 226.0 lb

## 2013-04-03 DIAGNOSIS — R609 Edema, unspecified: Secondary | ICD-10-CM

## 2013-04-03 DIAGNOSIS — I10 Essential (primary) hypertension: Secondary | ICD-10-CM

## 2013-04-03 DIAGNOSIS — Z23 Encounter for immunization: Secondary | ICD-10-CM

## 2013-04-03 LAB — BASIC METABOLIC PANEL
CO2: 29 mEq/L (ref 19–32)
Calcium: 9.1 mg/dL (ref 8.4–10.5)
Chloride: 102 mEq/L (ref 96–112)
Potassium: 3.5 mEq/L (ref 3.5–5.3)

## 2013-04-05 ENCOUNTER — Telehealth: Payer: Self-pay | Admitting: Internal Medicine

## 2013-04-05 MED ORDER — POTASSIUM CHLORIDE CRYS ER 20 MEQ PO TBCR: 20.0000 meq | EXTENDED_RELEASE_TABLET | Freq: Every day | ORAL | Status: DC

## 2013-04-05 NOTE — Progress Notes (Signed)
  Subjective:    Patient ID: Terri Montes, female    DOB: November 06, 1960, 52 y.o.   MRN: [id number]  HPI At last visit she was seen at several different issues. She had bilateral serous otitis media and vertigo. However she was actually here to followup on dependent edema. At that time on October 23, amlodipine was discontinued. Lasix was discontinued. She started on Demadex 20-40 mg daily for dependent edema and continued on losartan. She was treated with Zithromax Z-PAK for serous otitis media and meclizine for vertigo. Vertigo resolved. Ears feel okay. She's been taking Demadex 20 mg daily for dependent edema and has had significant improvement with that and stopping amlodipine. She's pleased with the result.    Review of Systems     Objective:   Physical Exam neck is supple without JVD thyromegaly or carotid bruits. Chest clear to auscultation. Cardiac exam regular rate and rhythm normal S1 and S2. No significant edema in the lower extremities at the present time.        Assessment & Plan:  Dependent edema-effectively treated with Demadex 20 mg daily after stopping amlodipine and Lasix.  Hypertension-stable on losartan  Plan: Return in 6 months for physical examination.

## 2013-04-05 NOTE — Addendum Note (Signed)
Addended by: Margaree Mackintosh on: 04/05/2013 02:08 PM   Modules accepted: Orders

## 2013-04-05 NOTE — Patient Instructions (Signed)
Continue Demadex and losartan. B-met done today. Return in 6 months.

## 2013-04-05 NOTE — Telephone Encounter (Signed)
Potassium checked on November 7 result is 3.5 on Demadex and losartan. Add K-Dur 20 meq daily and repeat  basicmetabolic panel in 3 weeks without office visit. Prescription called in to [facility]

## 2013-04-30 ENCOUNTER — Other Ambulatory Visit: Payer: 59 | Admitting: Internal Medicine

## 2013-05-04 ENCOUNTER — Encounter: Payer: Self-pay | Admitting: Internal Medicine

## 2013-05-04 ENCOUNTER — Ambulatory Visit (INDEPENDENT_AMBULATORY_CARE_PROVIDER_SITE_OTHER): Payer: 59 | Admitting: Internal Medicine

## 2013-05-04 VITALS — BP 126/86 | HR 76 | Temp 99.1°F | Ht 65.0 in | Wt 226.0 lb

## 2013-05-04 DIAGNOSIS — R609 Edema, unspecified: Secondary | ICD-10-CM

## 2013-05-04 DIAGNOSIS — K5900 Constipation, unspecified: Secondary | ICD-10-CM

## 2013-05-04 DIAGNOSIS — I10 Essential (primary) hypertension: Secondary | ICD-10-CM

## 2013-05-04 LAB — BASIC METABOLIC PANEL
BUN: 11 mg/dL (ref 6–23)
CO2: 25 mEq/L (ref 19–32)
Chloride: 105 mEq/L (ref 96–112)
Creat: 0.92 mg/dL (ref 0.50–1.10)
Sodium: 140 mEq/L (ref 135–145)

## 2013-05-04 NOTE — Patient Instructions (Addendum)
Continue same meds and return in 6 months. Continue potassium supplement in addition to other medications.

## 2013-06-26 ENCOUNTER — Telehealth: Payer: Self-pay | Admitting: Internal Medicine

## 2013-06-26 ENCOUNTER — Other Ambulatory Visit: Payer: Self-pay

## 2013-06-26 MED ORDER — DESVENLAFAXINE SUCCINATE ER 100 MG PO TB24: 100.0000 mg | ORAL_TABLET | Freq: Every day | ORAL | Status: DC

## 2013-06-26 NOTE — Telephone Encounter (Signed)
Please call in Pristiq 100 mg daily for stress at work increasing from 50 mg daily. Give several refills

## 2013-06-26 NOTE — Telephone Encounter (Signed)
Patient informed. 

## 2013-08-24 ENCOUNTER — Ambulatory Visit (INDEPENDENT_AMBULATORY_CARE_PROVIDER_SITE_OTHER): Payer: 59 | Admitting: Internal Medicine

## 2013-08-24 ENCOUNTER — Encounter: Payer: Self-pay | Admitting: Internal Medicine

## 2013-08-24 VITALS — BP 146/90 | Temp 98.7°F | Wt 217.0 lb

## 2013-08-24 DIAGNOSIS — H6590 Unspecified nonsuppurative otitis media, unspecified ear: Secondary | ICD-10-CM

## 2013-08-24 DIAGNOSIS — H6592 Unspecified nonsuppurative otitis media, left ear: Secondary | ICD-10-CM

## 2013-08-24 DIAGNOSIS — H6690 Otitis media, unspecified, unspecified ear: Secondary | ICD-10-CM

## 2013-08-24 DIAGNOSIS — H6691 Otitis media, unspecified, right ear: Secondary | ICD-10-CM

## 2013-08-24 DIAGNOSIS — H109 Unspecified conjunctivitis: Secondary | ICD-10-CM

## 2013-08-24 MED ORDER — OFLOXACIN 0.3 % OP SOLN: 1.0000 [drp] | Freq: Four times a day (QID) | OPHTHALMIC | Status: DC

## 2013-08-24 MED ORDER — AZITHROMYCIN 250 MG PO TABS: ORAL_TABLET | ORAL | Status: DC

## 2013-08-24 NOTE — Progress Notes (Signed)
   Subjective:    Patient ID: Terri Montes, female    DOB: 06-04-1960, 53 y.o.   MRN: [id number]  HPI Two day history of URI type symptoms. Has right ear pain and upper respiratory congestion. No sore throat. No documented fever. Has developed bilateral eye drainage. Does not wear contact lenses. Slight cough. Has malaise and fatigue. Slightly achy.    Review of Systems     Objective:   Physical Exam Bilateral conjunctivitis. Right TM is full and pink. Left TM is full but not red. Pharynx: slightly injected without exudate. Neck is supple without significant adenopathy. Chest clear to auscultation.        Assessment & Plan:  Bilateral conjunctivitis  Right otitis media  Left serous otitis media  Plan: Zithromax Z-Pak take 2 tabs day one followed by 1 tab days 2 through 5 with 1 refill. If not better in one week have prescription refilled. Ofloxacin ophthalmic drops 2 drops in each eye 4 times a day for 5 days. Patient says she does not need cough medication. Take ibuprofen or Tylenol for ear pain.

## 2013-08-24 NOTE — Patient Instructions (Signed)
Use ofloxacin drops in each eye 4 times daily x 5 days and take Z pak as directed.

## 2013-10-06 ENCOUNTER — Other Ambulatory Visit: Payer: 59 | Admitting: Internal Medicine

## 2013-10-06 DIAGNOSIS — Z13220 Encounter for screening for lipoid disorders: Secondary | ICD-10-CM

## 2013-10-06 DIAGNOSIS — Z130 Encounter for screening for diseases of the blood and blood-forming organs and certain disorders involving the immune mechanism: Secondary | ICD-10-CM

## 2013-10-06 DIAGNOSIS — Z1329 Encounter for screening for other suspected endocrine disorder: Secondary | ICD-10-CM

## 2013-10-06 DIAGNOSIS — I10 Essential (primary) hypertension: Secondary | ICD-10-CM

## 2013-10-06 DIAGNOSIS — Z13228 Encounter for screening for other metabolic disorders: Secondary | ICD-10-CM

## 2013-10-06 LAB — CBC WITH DIFFERENTIAL/PLATELET
BASOS ABS: 0 10*3/uL (ref 0.0–0.1)
BASOS PCT: 0 % (ref 0–1)
EOS ABS: 0.1 10*3/uL (ref 0.0–0.7)
EOS PCT: 1 % (ref 0–5)
HEMATOCRIT: 37.6 % (ref 36.0–46.0)
Hemoglobin: 12.7 g/dL (ref 12.0–15.0)
Lymphocytes Relative: 28 % (ref 12–46)
Lymphs Abs: 2 10*3/uL (ref 0.7–4.0)
MCH: 31.1 pg (ref 26.0–34.0)
MCHC: 33.8 g/dL (ref 30.0–36.0)
MCV: 92.2 fL (ref 78.0–100.0)
MONO ABS: 0.5 10*3/uL (ref 0.1–1.0)
Monocytes Relative: 7 % (ref 3–12)
Neutro Abs: 4.5 10*3/uL (ref 1.7–7.7)
Neutrophils Relative %: 64 % (ref 43–77)
Platelets: 299 10*3/uL (ref 150–400)
RBC: 4.08 MIL/uL (ref 3.87–5.11)
RDW: 12.8 % (ref 11.5–15.5)
WBC: 7 10*3/uL (ref 4.0–10.5)

## 2013-10-06 LAB — COMPREHENSIVE METABOLIC PANEL
ALK PHOS: 79 U/L (ref 39–117)
ALT: 14 U/L (ref 0–35)
AST: 17 U/L (ref 0–37)
Albumin: 4 g/dL (ref 3.5–5.2)
BILIRUBIN TOTAL: 0.5 mg/dL (ref 0.2–1.2)
BUN: 15 mg/dL (ref 6–23)
CO2: 29 mEq/L (ref 19–32)
CREATININE: 0.91 mg/dL (ref 0.50–1.10)
Calcium: 9.1 mg/dL (ref 8.4–10.5)
Chloride: 105 mEq/L (ref 96–112)
Glucose, Bld: 81 mg/dL (ref 70–99)
Potassium: 4 mEq/L (ref 3.5–5.3)
Sodium: 141 mEq/L (ref 135–145)
Total Protein: 7 g/dL (ref 6.0–8.3)

## 2013-10-06 LAB — LIPID PANEL
CHOL/HDL RATIO: 3.2 ratio
Cholesterol: 173 mg/dL (ref 0–200)
HDL: 54 mg/dL (ref >39)
LDL Cholesterol: 94 mg/dL (ref 0–99)
Triglycerides: 124 mg/dL (ref <150)
VLDL: 25 mg/dL (ref 0–40)

## 2013-10-07 LAB — TSH: TSH: 0.815 u[IU]/mL (ref 0.350–4.500)

## 2013-10-07 LAB — VITAMIN D 25 HYDROXY (VIT D DEFICIENCY, FRACTURES): Vit D, 25-Hydroxy: 21 ng/mL — ABNORMAL LOW (ref 30–89)

## 2013-10-09 ENCOUNTER — Encounter: Payer: Self-pay | Admitting: Internal Medicine

## 2013-10-09 ENCOUNTER — Ambulatory Visit (INDEPENDENT_AMBULATORY_CARE_PROVIDER_SITE_OTHER): Payer: 59 | Admitting: Internal Medicine

## 2013-10-09 VITALS — BP 142/90 | HR 84 | Temp 98.8°F | Ht 63.75 in | Wt 219.0 lb

## 2013-10-09 DIAGNOSIS — E669 Obesity, unspecified: Secondary | ICD-10-CM

## 2013-10-09 DIAGNOSIS — F411 Generalized anxiety disorder: Secondary | ICD-10-CM

## 2013-10-09 DIAGNOSIS — Z0000 Encounter for general adult medical examination without abnormal findings: Secondary | ICD-10-CM

## 2013-10-09 DIAGNOSIS — H18603 Keratoconus, unspecified, bilateral: Secondary | ICD-10-CM | POA: Insufficient documentation

## 2013-10-09 DIAGNOSIS — I10 Essential (primary) hypertension: Secondary | ICD-10-CM

## 2013-10-09 DIAGNOSIS — E559 Vitamin D deficiency, unspecified: Secondary | ICD-10-CM

## 2013-10-09 DIAGNOSIS — Z8669 Personal history of other diseases of the nervous system and sense organs: Secondary | ICD-10-CM

## 2013-10-09 DIAGNOSIS — R609 Edema, unspecified: Secondary | ICD-10-CM

## 2013-10-09 LAB — POCT URINALYSIS DIPSTICK
Bilirubin, UA: NEGATIVE
GLUCOSE UA: NEGATIVE
Ketones, UA: NEGATIVE
Leukocytes, UA: NEGATIVE
Nitrite, UA: NEGATIVE
Protein, UA: NEGATIVE
RBC UA: NEGATIVE
SPEC GRAV UA: 1.015
Urobilinogen, UA: NEGATIVE
pH, UA: 6

## 2013-10-09 NOTE — Patient Instructions (Addendum)
Restart Demadex and call with BP readings in 2 weeks. Otherwise return in 6 months.

## 2013-10-09 NOTE — Progress Notes (Signed)
Subjective:    Patient ID: Terri Montes, female    DOB: September 09, 1960, 53 y.o.   MRN: [id number]  HPI 53 year old White Female in today for health maintenance exam and evaluation of medical issues. Blood pressure is elevated today but she tells me she stopped taking Demadex. She does have history of dependent edema. 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 Eyeglasses have been recommended for now. She thought diuretic might be affecting her eyes. However she needs diuretic for dependent edema which is worse in hot weather and for blood pressure control. 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  History of anxiety and depression.  Social history: She is a widow. Husband committed suicide and was depressed regarding family business. She has 2 adult sons who reside with her. She's been in counseling to deal with stress related with in-laws.  Family history: Mother with history of goiter and hypertension. 2 brothers one of whom has hypertension  History of vasovagal syncope 1997  Septic tenosynovitis of left ankle secondary to injury at [place] 2002 with fragmentation of the peroneus brevis tendon requiring surgery. History of migraine headaches treated by Dr. Domingo Cocking. MRI 2005 was negative  Knee surgery 1987  Tetanus immunization February 2011  She is intolerant of gouty late from and Monistat. Allergic to sulfa. Imitrex as caused facial numbness and 5 group.  History of migraine headaches. History of internal hemorrhoids. History of constipation.  History of total abdominal hysterectomy without oophorectomy for fibroids in 2009  C-section 1995. 2 pregnancies. No miscarriages.      Review of Systems  Constitutional: Negative.   HENT: Negative.   Eyes:       See dictation regarding new diagnosis of INH she  Respiratory: Negative.   Cardiovascular: Positive for leg swelling.  Genitourinary:       Status post hysterectomy  without oophorectomy for fibroids  Neurological:       History of migraine headaches  Psychiatric/Behavioral:       Anxiety       Objective:   Physical Exam  Vitals reviewed. Constitutional: She appears well-developed and well-nourished. No distress.  HENT:  Head: Normocephalic and atraumatic.  Right Ear: External ear normal.  Left Ear: External ear normal.  Mouth/Throat: Oropharynx is clear and moist. No oropharyngeal exudate.  Eyes: Conjunctivae and EOM are normal. Pupils are equal, round, and reactive to light. Right eye exhibits no discharge. Left eye exhibits no discharge. No scleral icterus.  Neck: Neck supple. No JVD present. No thyromegaly present.  Cardiovascular: Normal rate, normal heart sounds and intact distal pulses.   No murmur heard. Pulmonary/Chest: Effort normal and breath sounds normal. She has no wheezes.  Breasts normal female without masses  Abdominal: Soft. Bowel sounds are normal. She exhibits no distension and no mass. There is no tenderness. There is no rebound and no guarding.  Genitourinary:  Had Pap smear by Dr. Hazle Coca 2013. Bimanual is normal.  Lymphadenopathy:    She has no cervical adenopathy.  Neurological: She is alert. She has normal reflexes. She displays normal reflexes. No cranial nerve deficit. Coordination normal.  Skin: Skin is warm and dry. No rash noted. She is not diaphoretic.  Psychiatric: She has a normal mood and affect. Her behavior is normal. Thought content normal.          Assessment & Plan:  Dependent edema  Hypertension-encourage take diuretic on a regular basis-encourage take diuretic on a regular basis  Situational stress with supervisor at work. Continues to go to counselor regarding grief and situational stress surrounding husband's death as well as how to handle in-laws.  Anxiety-has antianxiety medication on hand  Obesity-recommend diet exercise  Vitamin D deficiency-recommend 2000 units vitamin D 3  daily  Plan: Take diuretic on a regular basis. Continue counseling for stress. Return in 6 months.

## 2013-10-21 NOTE — Progress Notes (Signed)
   Subjective:    Patient ID: Terri Montes, female    DOB: 1960-08-27, 53 y.o.   MRN: [id number]  HPI At last visit potassium was 3.5 on diuretic. We started her on potassium supplement and she is back today for recheck. She's feeling well otherwise. Holidays are hard for her. She is taking losartan 100 mg daily and Demadex 20 mg daily. She is now 1 potassium chloride 20 mEq daily.    Review of Systems     Objective:   Physical Exam Chest clear to auscultation. Cardiac exam regular rate and rhythm. Extremities without pitting edema       Assessment & Plan:  Hypertension  History of dependent edema  Potassium supplementation with diuretic  Obesity  Plan: Continue same medications and return in 6 months. Potassium now 4.4 with potassium supplement.

## 2013-10-28 ENCOUNTER — Other Ambulatory Visit: Payer: Self-pay | Admitting: Internal Medicine

## 2014-01-31 ENCOUNTER — Encounter: Payer: Self-pay | Admitting: Internal Medicine

## 2014-02-02 ENCOUNTER — Other Ambulatory Visit: Payer: Self-pay

## 2014-02-02 MED ORDER — ALPRAZOLAM 0.5 MG PO TABS: 0.5000 mg | ORAL_TABLET | Freq: Every evening | ORAL | Status: DC | PRN

## 2014-03-05 ENCOUNTER — Other Ambulatory Visit: Payer: Self-pay | Admitting: Internal Medicine

## 2014-04-13 ENCOUNTER — Other Ambulatory Visit (INDEPENDENT_AMBULATORY_CARE_PROVIDER_SITE_OTHER): Payer: 59 | Admitting: Internal Medicine

## 2014-04-13 DIAGNOSIS — R609 Edema, unspecified: Secondary | ICD-10-CM

## 2014-04-13 DIAGNOSIS — Z23 Encounter for immunization: Secondary | ICD-10-CM

## 2014-04-13 DIAGNOSIS — I10 Essential (primary) hypertension: Secondary | ICD-10-CM

## 2014-04-13 NOTE — Addendum Note (Signed)
Addended by: Amado Coe on: 04/13/2014 09:35 AM   Modules accepted: Orders

## 2014-04-14 LAB — BASIC METABOLIC PANEL
BUN: 12 mg/dL (ref 6–23)
CO2: 26 mEq/L (ref 19–32)
Calcium: 9.2 mg/dL (ref 8.4–10.5)
Chloride: 104 mEq/L (ref 96–112)
Creat: 0.83 mg/dL (ref 0.50–1.10)
Glucose, Bld: 90 mg/dL (ref 70–99)
Potassium: 4.1 mEq/L (ref 3.5–5.3)
Sodium: 142 mEq/L (ref 135–145)

## 2014-04-15 ENCOUNTER — Encounter: Payer: Self-pay | Admitting: Internal Medicine

## 2014-04-15 ENCOUNTER — Ambulatory Visit (INDEPENDENT_AMBULATORY_CARE_PROVIDER_SITE_OTHER): Payer: 59 | Admitting: Internal Medicine

## 2014-04-15 VITALS — BP 142/88 | HR 97 | Temp 98.1°F | Wt 220.0 lb

## 2014-04-15 DIAGNOSIS — I10 Essential (primary) hypertension: Secondary | ICD-10-CM

## 2014-04-15 DIAGNOSIS — F32A Depression, unspecified: Secondary | ICD-10-CM

## 2014-04-15 DIAGNOSIS — F439 Reaction to severe stress, unspecified: Secondary | ICD-10-CM

## 2014-04-15 DIAGNOSIS — Z658 Other specified problems related to psychosocial circumstances: Secondary | ICD-10-CM

## 2014-04-15 DIAGNOSIS — F411 Generalized anxiety disorder: Secondary | ICD-10-CM

## 2014-04-15 DIAGNOSIS — F329 Major depressive disorder, single episode, unspecified: Secondary | ICD-10-CM

## 2014-04-15 DIAGNOSIS — R609 Edema, unspecified: Secondary | ICD-10-CM

## 2014-04-15 MED ORDER — ALPRAZOLAM 0.5 MG PO TABS: 0.5000 mg | ORAL_TABLET | Freq: Every evening | ORAL | Status: DC | PRN

## 2014-04-15 MED ORDER — ESCITALOPRAM OXALATE 20 MG PO TABS: 20.0000 mg | ORAL_TABLET | Freq: Every day | ORAL | Status: DC

## 2014-04-16 ENCOUNTER — Telehealth: Payer: Self-pay | Admitting: Internal Medicine

## 2014-04-16 MED ORDER — ALPRAZOLAM 0.5 MG PO TABS: ORAL_TABLET | ORAL | Status: DC

## 2014-04-16 NOTE — Telephone Encounter (Signed)
Please call in #90 with 3 refill. Make directions one half to one tablet 3 times daily.

## 2014-04-16 NOTE — Telephone Encounter (Signed)
Left message informing patient xanax rx with new directions called into pharmacy.

## 2014-04-16 NOTE — Telephone Encounter (Signed)
Patient states she was instructed to take Xanax 0.5 mg 1/2 tablet three times a day.  #90.  States her prescription was written for one tablet at bedtime #30.  Wanted to know if we could call in that correction and let her know when that has been taken care of.    Please advise.  Didn't see in the note because it was still open.  Thanks.

## 2014-05-13 ENCOUNTER — Other Ambulatory Visit: Payer: Self-pay | Admitting: Internal Medicine

## 2014-05-17 ENCOUNTER — Encounter: Payer: Self-pay | Admitting: Internal Medicine

## 2014-05-17 ENCOUNTER — Ambulatory Visit (INDEPENDENT_AMBULATORY_CARE_PROVIDER_SITE_OTHER): Payer: 59 | Admitting: Internal Medicine

## 2014-05-17 VITALS — BP 124/80 | HR 91 | Temp 98.4°F | Wt 223.0 lb

## 2014-05-17 DIAGNOSIS — J069 Acute upper respiratory infection, unspecified: Secondary | ICD-10-CM

## 2014-05-17 DIAGNOSIS — H6503 Acute serous otitis media, bilateral: Secondary | ICD-10-CM

## 2014-05-17 DIAGNOSIS — F32A Depression, unspecified: Secondary | ICD-10-CM

## 2014-05-17 DIAGNOSIS — F329 Major depressive disorder, single episode, unspecified: Secondary | ICD-10-CM

## 2014-05-17 DIAGNOSIS — F4321 Adjustment disorder with depressed mood: Secondary | ICD-10-CM

## 2014-05-17 NOTE — Progress Notes (Signed)
   Subjective:    Patient ID: Terri Montes, female    DOB: 1961/05/21, 53 y.o.   MRN: [id number]  HPI Onset several days ago of URI symptoms. Has cough drainage and congestion. No fever or shaking chills. Slight sore throat. Ears feel full. She is really here today to follow-up on depression. Feels Lexapro is making her less Siad. 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 Continues to see Doroteo Glassman for counseling. Father has inoperable lung cancer. She'll be spending the holidays with her parents.    Review of Systems     Objective:   Physical Exam  Spent 25 minutes with patient about these issues. TMs are full bilaterally but not red. Pharynx slightly injected. Neck is supple without adenopathy. She sounds nasally congested. Chest clear to auscultation.      Assessment & Plan:  History of depression  Grief reaction-regarding father's illness  Acute URI  Acute bilateral serous otitis media  Plan: Zithromax Z-Pak take as directed. Continue Lexapro 20 mg daily. Return in May 2016 for physical exam.

## 2014-05-17 NOTE — Patient Instructions (Signed)
Take Zithromax Z-PAK as directed for respiratory infection. Continue Lexapro 20 mg daily. Return in May 2016 for physical examination and lab work.

## 2014-05-18 ENCOUNTER — Telehealth: Payer: Self-pay | Admitting: *Deleted

## 2014-05-18 MED ORDER — AZITHROMYCIN 250 MG PO TABS: ORAL_TABLET | ORAL | Status: DC

## 2014-05-18 NOTE — Telephone Encounter (Signed)
Patient called states her pharmacy didn't get script for zpak Dr Renold Genta ordered yesterday Office visit note does indicate Zpak ordered will resend script

## 2014-05-31 NOTE — Progress Notes (Signed)
   Subjective:    Patient ID: Terri Montes, female    DOB: July 03, 1960, 54 y.o.   MRN: [id number]  HPI  Patient continues to see Doroteo Glassman for counseling. Her father has been diagnosed with terminal lung cancer. She is a widow. She still has some issues with her husband's parents not giving her privacy. Counseling is been helpful for that. 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 She has found it difficult to successfully interact with a supervisor. Feels that she cannot please supervisor. Has not had job issues in the past. Right now she is unhappy at work. She does have a boyfriend. That relationship is good. Her sons seemed to be doing well.  Her blood pressures elevated today but I think that is due to the stress she is under. She has a history of hypertension and dependent edema. She has been on Pristiq but doesn't feel that that is working.  She would like to change antidepressant medication.    Review of Systems     Objective:   Physical Exam  Neck supple. Chest clear. Cardiac exam regular rate and rhythm. Extremities without pitting edema. Affect: Mood is depressed. Thought process normal.      Assessment & Plan:  Depression  Hypertension-BP elevated today I think because of situational stress  History of dependent edema-stable on diuretic  Situational stress  Plan: Discontinue Pristiq and change to Lexapro 20 mg daily. Continue Xanax but increase dose to 3 times a day. Follow-up in 4 weeks. Continue to see Doroteo Glassman for counseling.  25 minutes spent with patient

## 2014-05-31 NOTE — Patient Instructions (Addendum)
Change Pristiq to Lexapro. Increase Xanax to 3 times daily. Continue counseling. Return in 4 weeks.

## 2014-06-09 ENCOUNTER — Other Ambulatory Visit: Payer: Self-pay | Admitting: Internal Medicine

## 2014-07-13 ENCOUNTER — Ambulatory Visit (INDEPENDENT_AMBULATORY_CARE_PROVIDER_SITE_OTHER): Payer: 59 | Admitting: Internal Medicine

## 2014-07-13 ENCOUNTER — Encounter: Payer: Self-pay | Admitting: Internal Medicine

## 2014-07-13 VITALS — BP 136/84 | HR 82 | Temp 98.2°F | Wt 218.0 lb

## 2014-07-13 DIAGNOSIS — H6503 Acute serous otitis media, bilateral: Secondary | ICD-10-CM

## 2014-07-13 DIAGNOSIS — J069 Acute upper respiratory infection, unspecified: Secondary | ICD-10-CM

## 2014-07-13 MED ORDER — AZITHROMYCIN 250 MG PO TABS: ORAL_TABLET | ORAL | Status: DC

## 2014-07-13 NOTE — Progress Notes (Signed)
   Subjective:    Patient ID: Terri Montes, female    DOB: 02-Sep-1960, 54 y.o.   MRN: [id number]  HPI  URI symptoms since last week. Ears feel stopped up. Has had cough and congestion. No fever or shaking chills. Had similar illness 05/17/2014 treated with a Z-Pak with success.    Review of Systems     Objective:   Physical Exam Skin warm and dry. Nodes none. She is coughing in the office. Pharynx not injected. TMs are full bilaterally but not red. Neck is supple. Chest clear to auscultation without rales or wheezing.       Assessment & Plan:  Acute URI  Acute serous otitis media bilateral  Plan: Zithromax Z-Pak take 2 tablets day one followed by 1 tablet days 2 through 5. She declined offer for narcotic cough syrup. Rest and drink plenty of fluids.

## 2014-07-13 NOTE — Patient Instructions (Signed)
Take Zithromax Z-PAK as directed.  Rest and drink plenty of fluids. 

## 2014-10-11 ENCOUNTER — Other Ambulatory Visit: Payer: 59 | Admitting: Internal Medicine

## 2014-10-12 ENCOUNTER — Other Ambulatory Visit: Payer: 59 | Admitting: Internal Medicine

## 2014-10-14 ENCOUNTER — Encounter: Payer: 59 | Admitting: Internal Medicine

## 2015-03-08 ENCOUNTER — Other Ambulatory Visit: Payer: 59 | Admitting: Internal Medicine

## 2015-03-08 DIAGNOSIS — Z130 Encounter for screening for diseases of the blood and blood-forming organs and certain disorders involving the immune mechanism: Secondary | ICD-10-CM

## 2015-03-08 DIAGNOSIS — Z13220 Encounter for screening for lipoid disorders: Secondary | ICD-10-CM

## 2015-03-08 DIAGNOSIS — Z1329 Encounter for screening for other suspected endocrine disorder: Secondary | ICD-10-CM

## 2015-03-08 DIAGNOSIS — Z1321 Encounter for screening for nutritional disorder: Secondary | ICD-10-CM

## 2015-03-08 DIAGNOSIS — Z0000 Encounter for general adult medical examination without abnormal findings: Secondary | ICD-10-CM

## 2015-03-08 LAB — COMPLETE METABOLIC PANEL WITH GFR
ALT: 12 U/L (ref 6–29)
AST: 17 U/L (ref 10–35)
Albumin: 4.2 g/dL (ref 3.6–5.1)
Alkaline Phosphatase: 81 U/L (ref 33–130)
BILIRUBIN TOTAL: 0.6 mg/dL (ref 0.2–1.2)
BUN: 12 mg/dL (ref 7–25)
CHLORIDE: 105 mmol/L (ref 98–110)
CO2: 27 mmol/L (ref 20–31)
Calcium: 9.2 mg/dL (ref 8.6–10.4)
Creat: 0.87 mg/dL (ref 0.50–1.05)
GFR, EST AFRICAN AMERICAN: 88 mL/min (ref >=60)
GFR, EST NON AFRICAN AMERICAN: 76 mL/min (ref >=60)
Glucose, Bld: 87 mg/dL (ref 65–99)
Potassium: 4.1 mmol/L (ref 3.5–5.3)
Sodium: 143 mmol/L (ref 135–146)
TOTAL PROTEIN: 6.7 g/dL (ref 6.1–8.1)

## 2015-03-08 LAB — CBC WITH DIFFERENTIAL/PLATELET
Basophils Absolute: 0.1 10*3/uL (ref 0.0–0.1)
Basophils Relative: 1 % (ref 0–1)
EOS ABS: 0.1 10*3/uL (ref 0.0–0.7)
EOS PCT: 2 % (ref 0–5)
HCT: 39.4 % (ref 36.0–46.0)
Hemoglobin: 13.2 g/dL (ref 12.0–15.0)
LYMPHS ABS: 1.8 10*3/uL (ref 0.7–4.0)
Lymphocytes Relative: 28 % (ref 12–46)
MCH: 30.6 pg (ref 26.0–34.0)
MCHC: 33.5 g/dL (ref 30.0–36.0)
MCV: 91.4 fL (ref 78.0–100.0)
MONO ABS: 0.6 10*3/uL (ref 0.1–1.0)
MONOS PCT: 9 % (ref 3–12)
MPV: 9.7 fL (ref 8.6–12.4)
Neutro Abs: 3.8 10*3/uL (ref 1.7–7.7)
Neutrophils Relative %: 60 % (ref 43–77)
PLATELETS: 286 10*3/uL (ref 150–400)
RBC: 4.31 MIL/uL (ref 3.87–5.11)
RDW: 12.9 % (ref 11.5–15.5)
WBC: 6.3 10*3/uL (ref 4.0–10.5)

## 2015-03-08 LAB — TSH: TSH: 1.055 u[IU]/mL (ref 0.350–4.500)

## 2015-03-08 LAB — LIPID PANEL
Cholesterol: 165 mg/dL (ref 125–200)
HDL: 45 mg/dL — ABNORMAL LOW (ref >=46)
LDL Cholesterol: 99 mg/dL (ref <130)
Total CHOL/HDL Ratio: 3.7 Ratio (ref <=5.0)
Triglycerides: 105 mg/dL (ref <150)
VLDL: 21 mg/dL (ref <30)

## 2015-03-09 LAB — VITAMIN D 25 HYDROXY (VIT D DEFICIENCY, FRACTURES): Vit D, 25-Hydroxy: 21 ng/mL — ABNORMAL LOW (ref 30–100)

## 2015-03-10 ENCOUNTER — Encounter: Payer: Self-pay | Admitting: Internal Medicine

## 2015-03-10 ENCOUNTER — Ambulatory Visit (INDEPENDENT_AMBULATORY_CARE_PROVIDER_SITE_OTHER): Payer: 59 | Admitting: Internal Medicine

## 2015-03-10 VITALS — BP 128/78 | HR 74 | Temp 97.9°F | Ht 64.0 in | Wt 219.0 lb

## 2015-03-10 DIAGNOSIS — Z0000 Encounter for general adult medical examination without abnormal findings: Secondary | ICD-10-CM | POA: Diagnosis not present

## 2015-03-10 DIAGNOSIS — H18609 Keratoconus, unspecified, unspecified eye: Secondary | ICD-10-CM

## 2015-03-10 DIAGNOSIS — Z8669 Personal history of other diseases of the nervous system and sense organs: Secondary | ICD-10-CM | POA: Diagnosis not present

## 2015-03-10 DIAGNOSIS — I10 Essential (primary) hypertension: Secondary | ICD-10-CM

## 2015-03-10 DIAGNOSIS — F411 Generalized anxiety disorder: Secondary | ICD-10-CM

## 2015-03-10 DIAGNOSIS — Z23 Encounter for immunization: Secondary | ICD-10-CM

## 2015-03-10 DIAGNOSIS — E669 Obesity, unspecified: Secondary | ICD-10-CM | POA: Diagnosis not present

## 2015-03-10 DIAGNOSIS — E559 Vitamin D deficiency, unspecified: Secondary | ICD-10-CM | POA: Diagnosis not present

## 2015-03-10 LAB — POCT URINALYSIS DIPSTICK
BILIRUBIN UA: NEGATIVE
GLUCOSE UA: NEGATIVE
Ketones, UA: NEGATIVE
LEUKOCYTES UA: NEGATIVE
NITRITE UA: NEGATIVE
PH UA: 6
Protein, UA: NEGATIVE
RBC UA: NEGATIVE
Spec Grav, UA: 1.01
UROBILINOGEN UA: NEGATIVE

## 2015-03-10 MED ORDER — KETOCONAZOLE 2 % EX CREA: 1.0000 "application " | TOPICAL_CREAM | Freq: Every day | CUTANEOUS | Status: DC

## 2015-03-11 ENCOUNTER — Encounter: Payer: Self-pay | Admitting: Internal Medicine

## 2015-03-17 ENCOUNTER — Other Ambulatory Visit: Payer: Self-pay | Admitting: Internal Medicine

## 2015-03-27 NOTE — Progress Notes (Signed)
   Subjective:    Patient ID: Terri Montes, female    DOB: Jun 21, 1960, 54 y.o.   MRN: [id number]  HPI  54 year old female in today for health maintenance exam and evaluation of medical issues. History of migraine headaches, vitamin D deficiency, dependent edema, essential hypertension, anxiety depression, obesity.  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 Eyeglasses have been recommended for now.  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 Was having issues with supervisor.  Social history: She is a widow. Husband committed suicide. He was depressed regarding family business. She has 2 adult sons. She's been in counseling to deal with stress. She has new boyfriend.  History of vasovagal syncope 1997. Septic tenosynovitis of left ankle secondary to injury at [place] 2002 with fragmentation of the peroneus brevis tendon requiring surgery. History of migraine headaches treated by Dr. Domingo Cocking in the past. MRI 2005 was negative.  Knee surgery 1987.  History of total abdominal hysterectomy without oophorectomy for fibroids in 2009. Serum section 1995. 2 pregnancies. No miscarriages.  Tetanus immunization February 2011.  Allergic to Sulfa. Imitrex  caused facial numbness.    Review of Systems  Constitutional: Negative.   Respiratory: Negative.   Cardiovascular: Negative.   Genitourinary: Negative.   Neurological: Negative.        Objective:   Physical Exam  Constitutional: She is oriented to person, place, and time. She appears well-developed and well-nourished. No distress.  HENT:  Head: Normocephalic and atraumatic.  Right Ear: External ear normal.  Eyes: Conjunctivae and EOM are normal. Pupils are equal, round, and reactive to light. Right eye exhibits no discharge. Left eye exhibits no discharge.  Neck: Neck supple. No JVD present. No thyromegaly present.  Cardiovascular: Normal rate, regular  rhythm and normal heart sounds.   No murmur heard. Pulmonary/Chest: Effort normal and breath sounds normal. No respiratory distress. She has no wheezes. She has no rales.  Abdominal: Soft. Bowel sounds are normal. She exhibits no distension and no mass. There is no tenderness. There is no rebound and no guarding.  Genitourinary:  Bimanual normal. No Pap taken due to hysterectomy  Musculoskeletal: Normal range of motion. She exhibits no edema.  Lymphadenopathy:    She has no cervical adenopathy.  Neurological: She is alert and oriented to person, place, and time. She has normal reflexes. No cranial nerve deficit.  Skin: Skin is warm and dry. No rash noted. She is not diaphoretic.  Psychiatric: She has a normal mood and affect. Her behavior is normal. Judgment and thought content normal.  Vitals reviewed.         Assessment & Plan:  Essential hypertension-stable  Obesity-no weight gain in the past year but no weight loss either. Prescription for Tenuate. Needs to get serious about diet and exercise  Vitamin D deficiency  History of anxiety  Dependent edema-stable and currently not an issue  Keratoconus  Plan: Continue diet and exercise efforts. Take 2000 units vitamin D 3 daily. Continue same medication for hypertension and return in 6 months. Needs to have screening colonoscopy, annual mammogram.

## 2015-03-27 NOTE — Patient Instructions (Addendum)
Was a pleasure to see you today. Return in 6 months. Take vitamin D supplement over-the-counter. Take Tenuate for appetite suppression. Continue diet and exercise efforts.

## 2015-04-02 ENCOUNTER — Encounter: Payer: Self-pay | Admitting: Emergency Medicine

## 2015-04-02 ENCOUNTER — Emergency Department
Admission: EM | Admit: 2015-04-02 | Discharge: 2015-04-02 | Disposition: A | Discharge status: Home / Self Care | Payer: 59 | Attending: Emergency Medicine | Admitting: Emergency Medicine

## 2015-04-02 ENCOUNTER — Emergency Department: Payer: 59

## 2015-04-02 DIAGNOSIS — Y9289 Other specified places as the place of occurrence of the external cause: Secondary | ICD-10-CM | POA: Insufficient documentation

## 2015-04-02 DIAGNOSIS — Y998 Other external cause status: Secondary | ICD-10-CM | POA: Insufficient documentation

## 2015-04-02 DIAGNOSIS — W01198A Fall on same level from slipping, tripping and stumbling with subsequent striking against other object, initial encounter: Secondary | ICD-10-CM | POA: Diagnosis not present

## 2015-04-02 DIAGNOSIS — S060X0A Concussion without loss of consciousness, initial encounter: Secondary | ICD-10-CM

## 2015-04-02 DIAGNOSIS — S01111A Laceration without foreign body of right eyelid and periocular area, initial encounter: Secondary | ICD-10-CM | POA: Diagnosis not present

## 2015-04-02 DIAGNOSIS — Z79899 Other long term (current) drug therapy: Secondary | ICD-10-CM | POA: Insufficient documentation

## 2015-04-02 DIAGNOSIS — Y9389 Activity, other specified: Secondary | ICD-10-CM | POA: Insufficient documentation

## 2015-04-02 DIAGNOSIS — S01119A Laceration without foreign body of unspecified eyelid and periocular area, initial encounter: Secondary | ICD-10-CM

## 2015-04-02 DIAGNOSIS — S0181XA Laceration without foreign body of other part of head, initial encounter: Secondary | ICD-10-CM | POA: Diagnosis not present

## 2015-04-02 DIAGNOSIS — I10 Essential (primary) hypertension: Secondary | ICD-10-CM | POA: Insufficient documentation

## 2015-04-02 DIAGNOSIS — S0990XA Unspecified injury of head, initial encounter: Secondary | ICD-10-CM | POA: Diagnosis present

## 2015-04-02 HISTORY — DX: Major depressive disorder, single episode, unspecified: F32.9

## 2015-04-02 HISTORY — DX: Essential (primary) hypertension: I10

## 2015-04-02 HISTORY — DX: Depression, unspecified: F32.A

## 2015-04-02 MED ORDER — LIDOCAINE-EPINEPHRINE (PF) 1 %-1:200000 IJ SOLN
30.0000 mL | Freq: Once | INTRAMUSCULAR | Status: AC
  Administered 2015-04-02: 30 mL
  Filled 2015-04-02: qty 30

## 2015-04-02 MED ORDER — TRAMADOL HCL 50 MG PO TABS
50.0000 mg | ORAL_TABLET | Freq: Once | ORAL | Status: AC
  Administered 2015-04-02: 50 mg via ORAL
  Filled 2015-04-02: qty 1

## 2015-04-02 MED ORDER — IBUPROFEN 800 MG PO TABS: 800.0000 mg | ORAL_TABLET | Freq: Three times a day (TID) | ORAL | Status: DC | PRN

## 2015-04-02 MED ORDER — IBUPROFEN 800 MG PO TABS
800.0000 mg | ORAL_TABLET | Freq: Once | ORAL | Status: AC
  Administered 2015-04-02: 800 mg via ORAL
  Filled 2015-04-02: qty 1

## 2015-04-02 MED ORDER — TRAMADOL HCL 50 MG PO TABS: 50.0000 mg | ORAL_TABLET | Freq: Four times a day (QID) | ORAL | Status: DC | PRN

## 2015-04-02 MED ORDER — BACITRACIN ZINC 500 UNIT/GM EX OINT
1.0000 "application " | TOPICAL_OINTMENT | Freq: Two times a day (BID) | CUTANEOUS | Status: DC
  Administered 2015-04-02: 1 via TOPICAL
  Filled 2015-04-02 (×2): qty 0.9

## 2015-04-02 NOTE — Discharge Instructions (Signed)
Concussion, Adult A concussion, or closed-head injury, is a brain injury caused by a direct blow to the head or by a quick and sudden movement (jolt) of the head or neck. Concussions are usually not life-threatening. Even so, the effects of a concussion can be serious. If you have had a concussion before, you are more likely to experience concussion-like symptoms after a direct blow to the head.  CAUSES  Direct blow to the head, such as from running into another player during a soccer game, being hit in a fight, or hitting your head on a hard surface.  A jolt of the head or neck that causes the brain to move back and forth inside the skull, such as in a car crash. SIGNS AND SYMPTOMS The signs of a concussion can be hard to notice. Early on, they may be missed by you, family members, and health care providers. You may look fine but act or feel differently. Symptoms are usually temporary, but they may last for days, weeks, or even longer. Some symptoms may appear right away while others may not show up for hours or days. Every head injury is different. Symptoms include:  Mild to moderate headaches that will not go away.  A feeling of pressure inside your head.  Having more trouble than usual:  Learning or remembering things you have heard.  Answering questions.  Paying attention or concentrating.  Organizing daily tasks.  Making decisions and solving problems.  Slowness in thinking, acting or reacting, speaking, or reading.  Getting lost or being easily confused.  Feeling tired all the time or lacking energy (fatigued).  Feeling drowsy.  Sleep disturbances.  Sleeping more than usual.  Sleeping less than usual.  Trouble falling asleep.  Trouble sleeping (insomnia).  Loss of balance or feeling lightheaded or dizzy.  Nausea or vomiting.  Numbness or tingling.  Increased sensitivity to:  Sounds.  Lights.  Distractions.  Vision problems or eyes that tire  easily.  Diminished sense of taste or smell.  Ringing in the ears.  Mood changes such as feeling sad or anxious.  Becoming easily irritated or angry for little or no reason.  Lack of motivation.  Seeing or hearing things other people do not see or hear (hallucinations). DIAGNOSIS Your health care provider can usually diagnose a concussion based on a description of your injury and symptoms. He or she will ask whether you passed out (lost consciousness) and whether you are having trouble remembering events that happened right before and during your injury. Your evaluation might include:  A brain scan to look for signs of injury to the brain. Even if the test shows no injury, you may still have a concussion.  Blood tests to be sure other problems are not present. TREATMENT  Concussions are usually treated in an emergency department, in urgent care, or at a clinic. You may need to stay in the hospital overnight for further treatment.  Tell your health care provider if you are taking any medicines, including prescription medicines, over-the-counter medicines, and natural remedies. Some medicines, such as blood thinners (anticoagulants) and aspirin, may increase the chance of complications. Also tell your health care provider whether you have had alcohol or are taking illegal drugs. This information may affect treatment.  Your health care provider will send you home with important instructions to follow.  How fast you will recover from a concussion depends on many factors. These factors include how severe your concussion is, what part of your brain was injured,  your age, and how healthy you were before the concussion.  Most people with mild injuries recover fully. Recovery can take time. In general, recovery is slower in older persons. Also, persons who have had a concussion in the past or have other medical problems may find that it takes longer to recover from their current injury. HOME  CARE INSTRUCTIONS General Instructions  Carefully follow the directions your health care provider gave you.  Only take over-the-counter or prescription medicines for pain, discomfort, or fever as directed by your health care provider.  Take only those medicines that your health care provider has approved.  Do not drink alcohol until your health care provider says you are well enough to do so. Alcohol and certain other drugs may slow your recovery and can put you at risk of further injury.  If it is harder than usual to remember things, write them down.  If you are easily distracted, try to do one thing at a time. For example, do not try to watch TV while fixing dinner.  Talk with family members or close friends when making important decisions.  Keep all follow-up appointments. Repeated evaluation of your symptoms is recommended for your recovery.  Watch your symptoms and tell others to do the same. Complications sometimes occur after a concussion. Older adults with a brain injury may have a higher risk of serious complications, such as a blood clot on the brain.  Tell your teachers, school nurse, school counselor, coach, athletic trainer, or work Freight forwarder about your injury, symptoms, and restrictions. Tell them about what you can or cannot do. They should watch for:  Increased problems with attention or concentration.  Increased difficulty remembering or learning new information.  Increased time needed to complete tasks or assignments.  Increased irritability or decreased ability to cope with stress.  Increased symptoms.  Rest. Rest helps the brain to heal. Make sure you:  Get plenty of sleep at night. Avoid staying up late at night.  Keep the same bedtime hours on weekends and weekdays.  Rest during the day. Take daytime naps or rest breaks when you feel tired.  Limit activities that require a lot of thought or concentration. These include:  Doing homework or job-related  work.  Watching TV.  Working on the computer.  Avoid any situation where there is potential for another head injury (football, hockey, soccer, basketball, martial arts, downhill snow sports and horseback riding). Your condition will get worse every time you experience a concussion. You should avoid these activities until you are evaluated by the appropriate follow-up health care providers. Returning To Your Regular Activities You will need to return to your normal activities slowly, not all at once. You must give your body and brain enough time for recovery.  Do not return to sports or other athletic activities until your health care provider tells you it is safe to do so.  Ask your health care provider when you can drive, ride a bicycle, or operate heavy machinery. Your ability to react may be slower after a brain injury. Never do these activities if you are dizzy.  Ask your health care provider about when you can return to work or school. Preventing Another Concussion It is very important to avoid another brain injury, especially before you have recovered. In rare cases, another injury can lead to permanent brain damage, brain swelling, or death. The risk of this is greatest during the first 7-10 days after a head injury. Avoid injuries by:  Wearing a  seat belt when riding in a car.  Drinking alcohol only in moderation.  Wearing a helmet when biking, skiing, skateboarding, skating, or doing similar activities.  Avoiding activities that could lead to a second concussion, such as contact or recreational sports, until your health care provider says it is okay.  Taking safety measures in your home.  Remove clutter and tripping hazards from floors and stairways.  Use grab bars in bathrooms and handrails by stairs.  Place non-slip mats on floors and in bathtubs.  Improve lighting in dim areas. SEEK MEDICAL CARE IF:  You have increased problems paying attention or  concentrating.  You have increased difficulty remembering or learning new information.  You need more time to complete tasks or assignments than before.  You have increased irritability or decreased ability to cope with stress.  You have more symptoms than before. Seek medical care if you have any of the following symptoms for more than 2 weeks after your injury:  Lasting (chronic) headaches.  Dizziness or balance problems.  Nausea.  Vision problems.  Increased sensitivity to noise or light.  Depression or mood swings.  Anxiety or irritability.  Memory problems.  Difficulty concentrating or paying attention.  Sleep problems.  Feeling tired all the time. SEEK IMMEDIATE MEDICAL CARE IF:  You have severe or worsening headaches. These may be a sign of a blood clot in the brain.  You have weakness (even if only in one hand, leg, or part of the face).  You have numbness.  You have decreased coordination.  You vomit repeatedly.  You have increased sleepiness.  One pupil is larger than the other.  You have convulsions.  You have slurred speech.  You have increased confusion. This may be a sign of a blood clot in the brain.  You have increased restlessness, agitation, or irritability.  You are unable to recognize people or places.  You have neck pain.  It is difficult to wake you up.  You have unusual behavior changes.  You lose consciousness. MAKE SURE YOU:  Understand these instructions.  Will watch your condition.  Will get help right away if you are not doing well or get worse.   This information is not intended to replace advice given to you by your health care provider. Make sure you discuss any questions you have with your health care provider.   Document Released: 08/04/2003 Document Revised: 06/04/2014 Document Reviewed: 12/04/2012 Elsevier Interactive Patient Education 2016 [employer]  Facial Laceration  A facial laceration is a cut  on the face. These injuries can be painful and cause bleeding. Lacerations usually heal quickly, but they need special care to reduce scarring. DIAGNOSIS  Your health care provider will take a medical history, ask for details about how the injury occurred, and examine the wound to determine how deep the cut is. TREATMENT  Some facial lacerations may not require closure. Others may not be able to be closed because of an increased risk of infection. The risk of infection and the chance for successful closure will depend on various factors, including the amount of time since the injury occurred. The wound may be cleaned to help prevent infection. If closure is appropriate, pain medicines may be given if needed. Your health care provider will use stitches (sutures), wound glue (adhesive), or skin adhesive strips to repair the laceration. These tools bring the skin edges together to allow for faster healing and a better cosmetic outcome. If needed, you may also be given a tetanus  shot. HOME CARE INSTRUCTIONS  Only take over-the-counter or prescription medicines as directed by your health care provider.  Follow your health care provider's instructions for wound care. These instructions will vary depending on the technique used for closing the wound. For Sutures:  Keep the wound clean and dry.   If you were given a bandage (dressing), you should change it at least once a day. Also change the dressing if it becomes wet or dirty, or as directed by your health care provider.   Wash the wound with soap and water 2 times a day. Rinse the wound off with water to remove all soap. Pat the wound dry with a clean towel.   After cleaning, apply a thin layer of the antibiotic ointment recommended by your health care provider. This will help prevent infection and keep the dressing from sticking.   You may shower as usual after the first 24 hours. Do not soak the wound in water until the sutures are removed.    Get your sutures removed as directed by your health care provider. With facial lacerations, sutures should usually be taken out after 4-5 days to avoid stitch marks.   Wait a few days after your sutures are removed before applying any makeup. For Skin Adhesive Strips:  Keep the wound clean and dry.   Do not get the skin adhesive strips wet. You may bathe carefully, using caution to keep the wound dry.   If the wound gets wet, pat it dry with a clean towel.   Skin adhesive strips will fall off on their own. You may trim the strips as the wound heals. Do not remove skin adhesive strips that are still stuck to the wound. They will fall off in time.  For Wound Adhesive:  You may briefly wet your wound in the shower or bath. Do not soak or scrub the wound. Do not swim. Avoid periods of heavy sweating until the skin adhesive has fallen off on its own. After showering or bathing, gently pat the wound dry with a clean towel.   Do not apply liquid medicine, cream medicine, ointment medicine, or makeup to your wound while the skin adhesive is in place. This may loosen the film before your wound is healed.   If a dressing is placed over the wound, be careful not to apply tape directly over the skin adhesive. This may cause the adhesive to be pulled off before the wound is healed.   Avoid prolonged exposure to sunlight or tanning lamps while the skin adhesive is in place.  The skin adhesive will usually remain in place for 5-10 days, then naturally fall off the skin. Do not pick at the adhesive film.  After Healing: Once the wound has healed, cover the wound with sunscreen during the day for 1 full year. This can help minimize scarring. Exposure to ultraviolet light in the first year will darken the scar. It can take 1-2 years for the scar to lose its redness and to heal completely.  SEEK MEDICAL CARE IF:  You have a fever. SEEK IMMEDIATE MEDICAL CARE IF:  You have redness, pain, or  swelling around the wound.   You see ayellowish-white fluid (pus) coming from the wound.    This information is not intended to replace advice given to you by your health care provider. Make sure you discuss any questions you have with your health care provider.   Document Released: 06/21/2004 Document Revised: 06/04/2014 Document Reviewed: 12/25/2012 Elsevier Interactive Patient  Education ©2016 Elsevier Inc. ° °

## 2015-04-02 NOTE — ED Notes (Signed)
Pt to ed with c/o laceration to left eyebrow after she tripped and fell and hit head on a table.  Pt denies loss of consciousness.

## 2015-04-02 NOTE — ED Notes (Signed)
Discussed discharge instructions, prescriptions, and follow-up care with patient. No questions or concerns at this time. Pt stable at discharge.  

## 2015-04-02 NOTE — ED Provider Notes (Signed)
[facility] Emergency Department Provider Note ____________________________________________  Time seen: Approximately 3:30 PM  I have reviewed the triage vital signs and the nursing notes.   HISTORY  Chief Complaint Laceration   HPI Terri Montes is a 54 y.o. female who presents to the emergency department for evaluation of a head injury and laceration. She tripped and fell while sweeping and hit the table. Laceration over the left eye. Confused, dizzy, and dazed after the fall. No loss consciousness. No vomiting. Headache and dizziness remain.  Location: left eyebrow Similar to previous headaches: No Duration: Constant since fall TIMING:Prior to arrival SEVERITY:8/10 QUALITY:Throbbing MODIFYING FACTORS: None ASSOCIATED SYMPTOMS: dizziness  Past Medical History  Diagnosis Date  . Hypertension   . Depression     Patient Active Problem List   Diagnosis Date Noted  . 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 Dependent edema 02/15/2013  . Situational stress 08/16/2012  . Anxiety state, unspecified 07/28/2012  . 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 Essential hypertension, benign 07/15/2012  . History of migraine headaches 01/22/2011    Past Surgical History  Procedure Laterality Date  . Abdominal hysterectomy  03/2010    partial  . Cesarean section  04/1994  . Breast surgery Right 2002    benign breast biopsy    Current Outpatient Rx  Name  Route  Sig  Dispense  Refill  . acetaminophen (TYLENOL) 325 MG tablet   Oral   Take 650 mg by mouth every 6 (six) hours as needed for pain.         Marland Kitchen ALPRAZolam (XANAX) 0.5 MG tablet      Take half to one tablet tid prn.   90 tablet   3   . escitalopram (LEXAPRO) 20 MG tablet      TAKE 1 TABLET BY MOUTH DAILY.   30 tablet   11   . ibuprofen (ADVIL,MOTRIN) 800 MG tablet   Oral   Take 1 tablet (800 mg total) by mouth every 8 (eight) hours as needed.   30 tablet   0   . ketoconazole (NIZORAL) 2 % cream    Topical   Apply 1 application topically daily.   30 g   prn   . ketotifen (ZADITOR) 0.025 % ophthalmic solution      1 drop 2 (two) times daily.         Marland Kitchen losartan (COZAAR) 100 MG tablet      TAKE 1 TABLET BY MOUTH DAILY FOR HYPERTENSION.   30 tablet   11   . mupirocin ointment (BACTROBAN) 2 %   Topical   Apply topically 3 (three) times daily.         Marland Kitchen torsemide (DEMADEX) 20 MG tablet      TAKE 1 TO 2 TABLETS BY MOUTH DAILY.   60 tablet   11   . traMADol (ULTRAM) 50 MG tablet   Oral   Take 1 tablet (50 mg total) by mouth every 6 (six) hours as needed.   9 tablet   0     Allergies Sulfa antibiotics  Family History  Problem Relation Age of Onset  . Hypertension Mother   . Hypertension Brother     Social History Social History  Substance Use Topics  . Smoking status: Never Smoker   . Smokeless tobacco: Never Used  . Alcohol Use: No    Review of Systems Constitutional: No fever/chills Eyes: No visual changes. ENT: No sore throat. Cardiovascular: Denies chest pain. Respiratory: Denies shortness of  breath. Gastrointestinal: No abdominal pain.  No nausea, no vomiting.  No diarrhea.  No constipation. Genitourinary: Negative for dysuria or incontinence. Musculoskeletal: Negative for pain. Skin: Negative for rash. Neurological:Positive for headache, Negative for focal weakness or numbness. Positive for confusion after the fall; Negative for fainting. Psychiatric:No anxiety or depression  10-point ROS otherwise negative.  ____________________________________________   PHYSICAL EXAM:  VITAL SIGNS: ED Triage Vitals  Enc Vitals Group     BP 04/02/15 1222 151/83 mmHg     Pulse Rate 04/02/15 1222 71     Resp 04/02/15 1222 20     Temp 04/02/15 1222 98.1 F (36.7 C)     Temp Source 04/02/15 1222 Oral     SpO2 04/02/15 1222 97 %     Weight 04/02/15 1222 200 lb (90.719 kg)     Height 04/02/15 1222 5\' 4"  (1.626 m)     Head Cir --      Peak Flow --       Pain Score 04/02/15 1223 8     Pain Loc --      Pain Edu? --      Excl. in [place]? --     Constitutional: Alert and oriented. Well appearing and in no acute distress. Eyes: Conjunctivae are normal. PERRL. EOMI. No evidence of papilledema on limited exam. Head: Atraumatic. Nose: No congestion/rhinnorhea. Mouth/Throat: Mucous membranes are moist.  Oropharynx non-erythematous. Neck: No stridor. No meningismus.   Cardiovascular: Normal rate, regular rhythm. Grossly normal heart sounds.  Good peripheral circulation. Respiratory: Normal respiratory effort.  No retractions. Lungs CTAB. Gastrointestinal: Soft and nontender. No distention. No abdominal bruits. No CVA tenderness. Musculoskeletal: No lower extremity tenderness nor edema.  No joint effusions. Neurologic:  Normal speech and language. No gross focal neurologic deficits are appreciated. No gait instability.  Cranial nerves: 2-10 normal as tested.  Sensorimotor: No aphasia, pronator drift, clonus, sensory loss or abnormal reflexes.  Skin:  2.5cm laceration noted diagonally through the left eyebrow. Psychiatric: Mood and affect are normal. Speech and behavior are normal. Normal thought process and cognition.  ____________________________________________   LABS (all labs ordered are listed, but only abnormal results are displayed)  Labs Reviewed - No data to display ____________________________________________  EKG   ____________________________________________  RADIOLOGY  CT head scan negative for acute findings. ____________________________________________   PROCEDURES  Procedure(s) performed: LACERATION REPAIR Performed by: Sherrie George Authorized by: Sherrie George Consent: Verbal consent obtained. Risks and benefits: risks, benefits and alternatives were discussed Consent given by: patient Patient identity confirmed: provided demographic data Prepped and Draped in normal sterile fashion Wound  explored  Laceration Location: left eyebrow  Laceration Length: 2.5cm  No Foreign Bodies seen or palpated  Anesthesia: local infiltration  Local anesthetic: lidocaine 1% with epinephrine  Anesthetic total: 3 ml  Irrigation method: syringe Amount of cleaning: standard  Skin closure: 6-0 Prolene  Number of sutures: 5  Technique: simple interrupted  Patient tolerance: Patient tolerated the procedure well with no immediate complications.   Critical Care performed:   ____________________________________________   INITIAL IMPRESSION / ASSESSMENT AND PLAN / ED COURSE  Pertinent labs & imaging results that were available during my care of the patient were reviewed by me and considered in my medical decision making (see chart for details).  Patient was advised to follow up with the primary care provider for recheck. She is to return here or see her PCP in 5 days for suture removal. Head injury instructions given to patient and family. Also advised to  return to the emergency department for symptoms that change or worsen if unable to schedule an appointment. ____________________________________________   FINAL CLINICAL IMPRESSION(S) / ED DIAGNOSES  Final diagnoses:  Concussion, without loss of consciousness, initial encounter  Laceration of eyebrow and forehead, initial encounter     Victorino Dike, FNP 04/02/15 1723  Daymon Larsen, MD 04/06/15 [phone]

## 2015-04-12 ENCOUNTER — Ambulatory Visit: Payer: 59 | Admitting: Internal Medicine

## 2015-04-13 ENCOUNTER — Ambulatory Visit: Payer: 59 | Admitting: Internal Medicine

## 2015-04-13 ENCOUNTER — Telehealth: Payer: Self-pay | Admitting: Internal Medicine

## 2015-04-13 NOTE — Telephone Encounter (Signed)
States that her son, Rodman Key removed her stitches, so she will not need to come in for her appointment today.    Thanks.

## 2015-04-29 ENCOUNTER — Ambulatory Visit (AMBULATORY_SURGERY_CENTER): Payer: Self-pay | Admitting: *Deleted

## 2015-04-29 VITALS — Ht 63.5 in | Wt 218.0 lb

## 2015-04-29 DIAGNOSIS — Z1211 Encounter for screening for malignant neoplasm of colon: Secondary | ICD-10-CM

## 2015-04-29 MED ORDER — NA SULFATE-K SULFATE-MG SULF 17.5-3.13-1.6 GM/177ML PO SOLN: 1.0000 | Freq: Once | ORAL | Status: DC

## 2015-04-29 NOTE — Progress Notes (Signed)
No egg or soy allergy. No anesthesia problems.  No home O2.  Pt told to hold diethylpropion for 10 days prior to procedure 05/02/15.

## 2015-05-05 ENCOUNTER — Encounter: Payer: Self-pay | Admitting: Internal Medicine

## 2015-05-12 ENCOUNTER — Encounter: Payer: Self-pay | Admitting: Internal Medicine

## 2015-05-12 ENCOUNTER — Ambulatory Visit (AMBULATORY_SURGERY_CENTER): Payer: 59 | Admitting: Internal Medicine

## 2015-05-12 VITALS — BP 121/59 | HR 68 | Temp 96.7°F | Resp 16 | Ht 63.0 in | Wt 218.0 lb

## 2015-05-12 DIAGNOSIS — Z1211 Encounter for screening for malignant neoplasm of colon: Secondary | ICD-10-CM | POA: Diagnosis not present

## 2015-05-12 MED ORDER — SODIUM CHLORIDE 0.9 % IV SOLN: 500.0000 mL | INTRAVENOUS | Status: DC

## 2015-05-12 NOTE — Progress Notes (Signed)
To recovery, report to [employer], RN, VSS.

## 2015-05-12 NOTE — Op Note (Signed)
[place]  [address]. [place], [id number]   COLONOSCOPY PROCEDURE REPORT  PATIENT: Terri Montes, Terri Montes  MR#: [id number] BIRTHDATE: [birth year] , 35  yrs. old GENDER: female ENDOSCOPIST: Jerene Bears, MD REFERRED [id number] Parke Simmers, M.D. PROCEDURE DATE:  05/12/2015 PROCEDURE:   Colonoscopy, screening First Screening Colonoscopy - Avg.  risk and is 50 yrs.  old or older Yes.  Prior Negative Screening - Now for repeat screening. N/A  History of Adenoma - Now for follow-up colonoscopy & has been > or = to 3 yrs.  N/A  Polyps removed today? No Recommend repeat exam, <10 yrs? No ASA CLASS:   Class II INDICATIONS:Screening for colonic neoplasia and Colorectal Neoplasm Risk Assessment for this procedure is average risk. MEDICATIONS: Monitored anesthesia care and Propofol 240 mg IV  DESCRIPTION OF PROCEDURE:   After the risks benefits and alternatives of the procedure were thoroughly explained, informed consent was obtained.  The digital rectal exam revealed several skin tags.   The LB [id number]  endoscope was introduced through the anus and advanced to the cecum, which was identified by both the appendix and ileocecal valve. No adverse events experienced.   The quality of the prep was good.  (Suprep was used) The instrument was then slowly withdrawn as the colon was fully examined. Estimated blood loss is zero unless otherwise noted in this procedure report.      COLON FINDINGS: There was mild diverticulosis noted in the sigmoid colon.   The examination was otherwise normal.  Retroflexed views revealed internal hemorrhoids and anal skin tags. The time to cecum = 5.0 Withdrawal time = 9.3   The scope was withdrawn and the procedure completed. COMPLICATIONS: There were no immediate complications.  ENDOSCOPIC IMPRESSION: 1.   Mild diverticulosis was noted in the sigmoid colon 2.   The examination was otherwise normal  RECOMMENDATIONS: 1.  High fiber  diet 2.  You should continue to follow colorectal cancer screening guidelines for "routine risk" patients with a repeat colonoscopy in 10 years.  There is no need for FOBT (stool) testing for at least 5 years.  eSigned:  Jerene Bears, MD 05/12/2015 10:29 AM   cc: [facility], MD and The Patient

## 2015-05-12 NOTE — Patient Instructions (Signed)
YOU HAD AN ENDOSCOPIC PROCEDURE TODAY AT [facility]:   Refer to the procedure report that was given to you for any specific questions about what was found during the examination.  If the procedure report does not answer your questions, please call your gastroenterologist to clarify.  If you requested that your care partner not be given the details of your procedure findings, then the procedure report has been included in a sealed envelope for you to review at your convenience later.  YOU SHOULD EXPECT: Some feelings of bloating in the abdomen. Passage of more gas than usual.  Walking can help get rid of the air that was put into your GI tract during the procedure and reduce the bloating. If you had a lower endoscopy (such as a colonoscopy or flexible sigmoidoscopy) you may notice spotting of blood in your stool or on the toilet paper. If you underwent a bowel prep for your procedure, you may not have a normal bowel movement for a few days.  Please Note:  You might notice some irritation and congestion in your nose or some drainage.  This is from the oxygen used during your procedure.  There is no need for concern and it should clear up in a day or so.  SYMPTOMS TO REPORT IMMEDIATELY:   Following lower endoscopy (colonoscopy or flexible sigmoidoscopy):  Excessive amounts of blood in the stool  Significant tenderness or worsening of abdominal pains  Swelling of the abdomen that is new, acute  Fever of 100F or higher  For urgent or emergent issues, a gastroenterologist can be reached at any hour by calling [phone].   DIET: Your first meal following the procedure should be a small meal and then it is ok to progress to your normal diet. Heavy or fried foods are harder to digest and may make you feel nauseous or bloated.  Likewise, meals heavy in dairy and vegetables can increase bloating.  Drink plenty of fluids but you should avoid alcoholic beverages for 24  hours.  ACTIVITY:  You should plan to take it easy for the rest of today and you should NOT DRIVE or use heavy machinery until tomorrow (because of the sedation medicines used during the test).    FOLLOW UP: Our staff will call the number listed on your records the next business day following your procedure to check on you and address any questions or concerns that you may have regarding the information given to you following your procedure. If we do not reach you, we will leave a message.  However, if you are feeling well and you are not experiencing any problems, there is no need to return our call.  We will assume that you have returned to your regular daily activities without incident.  SIGNATURES/CONFIDENTIALITY: You and/or your care partner have signed paperwork which will be entered into your electronic medical record.  These signatures attest to the fact that that the information above on your After Visit Summary has been reviewed and is understood.  Full responsibility of the confidentiality of this discharge information lies with you and/or your care-partner.  Please read over handouts about diverticulosis and high fiber diets  Please continue your normal medications  Next colonoscopy- 10 years

## 2015-05-13 ENCOUNTER — Telehealth: Payer: Self-pay | Admitting: *Deleted

## 2015-05-13 NOTE — Telephone Encounter (Signed)
  Follow up Call-  Call back number 05/12/2015  Post procedure Call Back phone  # [phone]  Permission to leave phone message Yes     Patient questions:  Do you have a fever, pain , or abdominal swelling? No. Pain Score  0 *  Have you tolerated food without any problems? Yes.    Have you been able to return to your normal activities? Yes.    Do you have any questions about your discharge instructions: Diet   Yes.   Medications  No. Follow up visit  No.  Do you have questions or concerns about your Care? Yes.    Actions: * If pain score is 4 or above: No action needed, pain <4.

## 2015-06-21 ENCOUNTER — Other Ambulatory Visit: Payer: Self-pay | Admitting: Internal Medicine

## 2015-07-29 ENCOUNTER — Encounter: Payer: Self-pay | Admitting: Internal Medicine

## 2015-07-29 ENCOUNTER — Ambulatory Visit (INDEPENDENT_AMBULATORY_CARE_PROVIDER_SITE_OTHER): Payer: Managed Care, Other (non HMO) | Admitting: Internal Medicine

## 2015-07-29 VITALS — BP 148/86 | HR 73 | Temp 97.7°F | Resp 20 | Ht 63.0 in | Wt 222.0 lb

## 2015-07-29 DIAGNOSIS — L02231 Carbuncle of abdominal wall: Secondary | ICD-10-CM | POA: Diagnosis not present

## 2015-07-29 MED ORDER — DOXYCYCLINE HYCLATE 100 MG PO TABS: 100.0000 mg | ORAL_TABLET | Freq: Two times a day (BID) | ORAL | Status: DC

## 2015-07-29 MED ORDER — MUPIROCIN 2 % EX OINT: TOPICAL_OINTMENT | CUTANEOUS | Status: DC

## 2015-07-29 MED ORDER — FLUCONAZOLE 150 MG PO TABS: 150.0000 mg | ORAL_TABLET | Freq: Once | ORAL | Status: DC

## 2015-07-29 NOTE — Progress Notes (Signed)
   Subjective:    Patient ID: Terri Montes, female    DOB: Jul 08, 1960, 55 y.o.   MRN: [id number]  HPI Onset yesterday of carbuncle mid lower abdomen at site of hysterectomy scar. Says it drained some pus. History of axilla carbuncle 2014. History of hypertension and anxiety. Blood pressure is elevated today. Has not been monitoring it at home.    Review of Systems     Objective:   Physical Exam  Mid lower abdomen 1 cm carbuncle with central abraded area that is not draining. Surrounding erythema in skin fold consistent with intertrigo      Assessment & Plan:  Carbuncle abdominal wall area  Elevated blood pressure-monitor blood pressure at home  History of anxiety   Plan: Cleaned with peroxide twice daily. Doxycycline 100 mg twice daily for 10 days. Apply a small amount of Bactroban to abraded area twice daily. Apply Bactroban to nostrils twice daily for 2-3 weeks. Warm hot compresses. Call if symptoms do not improve. Bathe in Hibiclens daily for 6 weeks. Diflucan prescribed in case she develops Candida vaginitis balm antibiotics

## 2015-07-29 NOTE — Patient Instructions (Signed)
Bathe  in  Hibiclens daily for 6 weeks. Warm hot compresses to carbuncle. Doxycycline 100 mg twice daily for 10 days. Monitor blood pressure at home. Apply Bactroban to carbuncle sparingly and also to each nostril nightly for 2-3 weeks.

## 2015-09-08 ENCOUNTER — Telehealth: Payer: Self-pay | Admitting: Internal Medicine

## 2015-09-08 ENCOUNTER — Ambulatory Visit: Payer: 59 | Admitting: Internal Medicine

## 2015-09-08 NOTE — Telephone Encounter (Signed)
Patient called on Tuesday, 09/06/15 to inquire as to what her appointment was for on 4/11 @ 3:45.  Advised it was her 6 month f/u.  She wanted to know what for?  Advised to f/u on her medications, etc.  Patient advised that she wasn't taking the medication that Dr. Renold Genta gave her.  Medication for weight loss she stated it didn't do well for her and so she wasn't taking the medication.  Therefore, patient didn't want to keep her appointment.    Patient further stated that she is not currently working and she doesn't have any insurance.  Therefore, she cannot afford to come for this visit at this time.  Advised I would note in her chart.  Patient will try to obtain a job and insurance and see [place] back for her physical exam when it is due.  Advised patient that Dr. Renold Genta would want to see her at least yearly to refill her medications such has BP medications, etc.

## 2015-10-25 ENCOUNTER — Telehealth: Payer: Self-pay | Admitting: Internal Medicine

## 2015-10-25 ENCOUNTER — Other Ambulatory Visit: Payer: Self-pay | Admitting: Internal Medicine

## 2015-10-25 MED ORDER — ESCITALOPRAM OXALATE 20 MG PO TABS: 20.0000 mg | ORAL_TABLET | Freq: Every day | ORAL | Status: DC

## 2015-10-25 NOTE — Telephone Encounter (Signed)
Refill Lexapro x 60 days

## 2015-10-25 NOTE — Telephone Encounter (Addendum)
Will have insurance starting 10/27/15.  She will call and schedule her physical to see you in the next month or so once she gets all of that information settled with her insurance.  She would actually be due for a CPE with you in mid October.  She verified that you are on the plan.  This insurance is through the [facility].    You and she also talked about her backing off of Lexapro, but she has not been able to do that.  So, she has requested refills on her medication through her pharmacy (including Lexapro).  She wanted to let us know that her refill requests would be coming through.    Thank you.

## 2016-02-27 ENCOUNTER — Other Ambulatory Visit: Payer: Self-pay | Admitting: Internal Medicine

## 2016-02-27 NOTE — Telephone Encounter (Signed)
Last CPE was October 2016. Needs appt for PE before can refill

## 2016-02-28 NOTE — Telephone Encounter (Signed)
CPE appt made for 05/08/16, can she get Rx refill until then?

## 2016-03-30 ENCOUNTER — Other Ambulatory Visit: Payer: Self-pay | Admitting: Internal Medicine

## 2016-05-07 ENCOUNTER — Other Ambulatory Visit: Payer: BLUE CROSS/BLUE SHIELD | Admitting: Internal Medicine

## 2016-05-07 DIAGNOSIS — Z130 Encounter for screening for diseases of the blood and blood-forming organs and certain disorders involving the immune mechanism: Secondary | ICD-10-CM

## 2016-05-07 DIAGNOSIS — Z13220 Encounter for screening for lipoid disorders: Secondary | ICD-10-CM

## 2016-05-07 DIAGNOSIS — Z1329 Encounter for screening for other suspected endocrine disorder: Secondary | ICD-10-CM

## 2016-05-07 DIAGNOSIS — I10 Essential (primary) hypertension: Secondary | ICD-10-CM

## 2016-05-07 DIAGNOSIS — Z0000 Encounter for general adult medical examination without abnormal findings: Secondary | ICD-10-CM

## 2016-05-07 DIAGNOSIS — Z1321 Encounter for screening for nutritional disorder: Secondary | ICD-10-CM

## 2016-05-07 LAB — CBC WITH DIFFERENTIAL/PLATELET
BASOS ABS: 67 {cells}/uL (ref 0–200)
Basophils Relative: 1 %
EOS ABS: 134 {cells}/uL (ref 15–500)
Eosinophils Relative: 2 %
HCT: 41.7 % (ref 35.0–45.0)
HEMOGLOBIN: 13.7 g/dL (ref 11.7–15.5)
LYMPHS ABS: 1876 {cells}/uL (ref 850–3900)
Lymphocytes Relative: 28 %
MCH: 30.6 pg (ref 27.0–33.0)
MCHC: 32.9 g/dL (ref 32.0–36.0)
MCV: 93.3 fL (ref 80.0–100.0)
MONOS PCT: 8 %
MPV: 9.6 fL (ref 7.5–12.5)
Monocytes Absolute: 536 cells/uL (ref 200–950)
NEUTROS ABS: 4087 {cells}/uL (ref 1500–7800)
NEUTROS PCT: 61 %
Platelets: 304 10*3/uL (ref 140–400)
RBC: 4.47 MIL/uL (ref 3.80–5.10)
RDW: 12.8 % (ref 11.0–15.0)
WBC: 6.7 10*3/uL (ref 3.8–10.8)

## 2016-05-07 LAB — COMPLETE METABOLIC PANEL WITH GFR
ALBUMIN: 4.4 g/dL (ref 3.6–5.1)
ALK PHOS: 85 U/L (ref 33–130)
ALT: 17 U/L (ref 6–29)
AST: 21 U/L (ref 10–35)
BILIRUBIN TOTAL: 0.6 mg/dL (ref 0.2–1.2)
BUN: 16 mg/dL (ref 7–25)
CO2: 28 mmol/L (ref 20–31)
Calcium: 9.4 mg/dL (ref 8.6–10.4)
Chloride: 105 mmol/L (ref 98–110)
Creat: 0.9 mg/dL (ref 0.50–1.05)
GFR, EST AFRICAN AMERICAN: 83 mL/min (ref >=60)
GFR, EST NON AFRICAN AMERICAN: 72 mL/min (ref >=60)
Glucose, Bld: 85 mg/dL (ref 65–99)
Potassium: 4 mmol/L (ref 3.5–5.3)
Sodium: 142 mmol/L (ref 135–146)
Total Protein: 7.2 g/dL (ref 6.1–8.1)

## 2016-05-07 LAB — LIPID PANEL
CHOLESTEROL: 184 mg/dL (ref <200)
HDL: 49 mg/dL — AB (ref >50)
LDL Cholesterol: 98 mg/dL (ref <100)
TRIGLYCERIDES: 186 mg/dL — AB (ref <150)
Total CHOL/HDL Ratio: 3.8 Ratio (ref <5.0)
VLDL: 37 mg/dL — ABNORMAL HIGH (ref <30)

## 2016-05-07 LAB — TSH: TSH: 1.09 mIU/L

## 2016-05-08 ENCOUNTER — Ambulatory Visit (INDEPENDENT_AMBULATORY_CARE_PROVIDER_SITE_OTHER): Payer: BLUE CROSS/BLUE SHIELD | Admitting: Internal Medicine

## 2016-05-08 ENCOUNTER — Encounter: Payer: Self-pay | Admitting: Internal Medicine

## 2016-05-08 ENCOUNTER — Other Ambulatory Visit: Payer: Self-pay | Admitting: Internal Medicine

## 2016-05-08 VITALS — BP 144/88 | HR 83 | Temp 98.3°F | Ht 63.0 in | Wt 230.0 lb

## 2016-05-08 DIAGNOSIS — I10 Essential (primary) hypertension: Secondary | ICD-10-CM

## 2016-05-08 DIAGNOSIS — Z23 Encounter for immunization: Secondary | ICD-10-CM | POA: Diagnosis not present

## 2016-05-08 DIAGNOSIS — Z0000 Encounter for general adult medical examination without abnormal findings: Secondary | ICD-10-CM | POA: Diagnosis not present

## 2016-05-08 DIAGNOSIS — Z1231 Encounter for screening mammogram for malignant neoplasm of breast: Secondary | ICD-10-CM

## 2016-05-08 DIAGNOSIS — E559 Vitamin D deficiency, unspecified: Secondary | ICD-10-CM

## 2016-05-08 DIAGNOSIS — Z8669 Personal history of other diseases of the nervous system and sense organs: Secondary | ICD-10-CM | POA: Diagnosis not present

## 2016-05-08 DIAGNOSIS — E781 Pure hyperglyceridemia: Secondary | ICD-10-CM

## 2016-05-08 DIAGNOSIS — F411 Generalized anxiety disorder: Secondary | ICD-10-CM | POA: Diagnosis not present

## 2016-05-08 DIAGNOSIS — H18609 Keratoconus, unspecified, unspecified eye: Secondary | ICD-10-CM | POA: Diagnosis not present

## 2016-05-08 LAB — POCT URINALYSIS DIPSTICK
BILIRUBIN UA: NEGATIVE
GLUCOSE UA: NEGATIVE
Ketones, UA: NEGATIVE
Leukocytes, UA: NEGATIVE
Nitrite, UA: NEGATIVE
Protein, UA: NEGATIVE
SPEC GRAV UA: 1.015
Urobilinogen, UA: NEGATIVE
pH, UA: 6

## 2016-05-08 LAB — VITAMIN D 25 HYDROXY (VIT D DEFICIENCY, FRACTURES): Vit D, 25-Hydroxy: 21 ng/mL — ABNORMAL LOW (ref 30–100)

## 2016-05-08 NOTE — Progress Notes (Signed)
Subjective:    Patient ID: Terri Montes, female    DOB: [birth year], 55 y.o.   MRN: [id number]  HPI  55 year old Female for health maintenance exam and evaluation of medical issues.  She currently is looking for another job. She formerly worked for [employer] but had issues with a Mudlogger and apparently was terminated from her position the spring of 2016. Subsequently went to work for [employer] but was laid off from that position. Would like to go back to work for [employer].  History of migraine headaches, hypertension, dependent edema, vitamin D deficiency, obesity, anxiety depression.  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 Eyeglasses have been recommended for now.  Knee surgery 1987. Vasovagal syncope 1997. Septic tenosynovitis of left ankle secondary to an injury at [place] 2002 with fragmentation of the prone use brevis tendon requiring surgery. Migraine headaches treated by Dr. Domingo Cocking in the past. MRI of the brain 2005 was negative.  History of told him we'll hysterectomy without oophorectomy for fibroids in 2009. C-section 1995. 2 pregnancies. No miscarriages.  Tetanus immunization February 2011  She is allergic to sulfa. Imitrex causes facial numbness.  Social history: She is a widow. Has 2 adult sons. Has a steady boyfriend.  Family history: Mother requiring lots of help after father passed earlier this year complications of lung cancer.    Review of Systems  Constitutional: Negative.   HENT: Negative.   Respiratory: Negative.   Cardiovascular: Negative.   Gastrointestinal: Negative.   Genitourinary: Negative.   Neurological:       History of migraine headaches  Psychiatric/Behavioral:       Situational stress with job hunting and caring for mother       Objective:   Physical Exam  Constitutional: She is oriented to person, place, and time. She appears well-developed and well-nourished. No distress.    HENT:  Head: Atraumatic.  Right Ear: External ear normal.  Left Ear: External ear normal.  Mouth/Throat: Oropharynx is clear and moist. No oropharyngeal exudate.  Eyes: Conjunctivae and EOM are normal. Pupils are equal, round, and reactive to light. Right eye exhibits no discharge. Left eye exhibits no discharge. No scleral icterus.  Neck: Neck supple. No JVD present. No thyromegaly present.  Cardiovascular: Normal rate, regular rhythm, normal heart sounds and intact distal pulses.   No murmur heard. Pulmonary/Chest: Effort normal and breath sounds normal. No respiratory distress. She has no wheezes. She has no rales.  Abdominal: Soft. She exhibits no distension and no mass. There is no tenderness. There is no rebound and no guarding.  Genitourinary:  Genitourinary Comments: Status post hysterectomy. No Pap taken. Bimanual normal.  Musculoskeletal: She exhibits no edema.  Neurological: She is alert and oriented to person, place, and time. She has normal reflexes. No cranial nerve deficit. Coordination normal.  Skin: Skin is warm and dry. No rash noted. She is not diaphoretic. No erythema.  Psychiatric: She has a normal mood and affect. Her behavior is normal. Thought content normal.  Vitals reviewed.         Assessment & Plan:  Hypertriglyceridemia-Triglycerides have increased from 105 a year ago to 186. Recommend diet exercise and weight loss  Obesity  Essential hypertension-blood pressure elevated today. Continue to monitor at home  History of migraine headaches  History of vitamin D deficiency-vitamin D level is 21 take 2000 units vitamin D 3 daily  History of anxiety  History of dependent edema  Keratoconus  Plan: Encouraged diet exercise and weight loss. She used to ride her bicycle but quit doing that very much. Recommend she return to that as it was good exercise and she lost weight and felt better. Return in 6 months for office visit blood pressure check and lipid  panel. Continue to monitor blood pressure at home. If persistently elevated, recontact me before 6 months.

## 2016-05-27 ENCOUNTER — Encounter: Payer: Self-pay | Admitting: Internal Medicine

## 2016-05-27 NOTE — Patient Instructions (Signed)
Monitor blood pressure at home. Restart exercise program. Diet exercise and weight loss recommended. Take vitamin D 2000 units daily. Return in 6 months for follow-up. Contact me sooner if blood pressure is not under control

## 2016-05-30 ENCOUNTER — Ambulatory Visit
Admission: RE | Admit: 2016-05-30 | Discharge: 2016-05-30 | Disposition: A | Discharge status: Home / Self Care | Payer: BLUE CROSS/BLUE SHIELD | Source: Ambulatory Visit | Attending: Internal Medicine | Admitting: Internal Medicine

## 2016-05-30 DIAGNOSIS — Z0000 Encounter for general adult medical examination without abnormal findings: Secondary | ICD-10-CM

## 2016-05-30 DIAGNOSIS — Z1231 Encounter for screening mammogram for malignant neoplasm of breast: Secondary | ICD-10-CM

## 2016-06-18 ENCOUNTER — Encounter: Payer: Self-pay | Admitting: Internal Medicine

## 2016-06-18 ENCOUNTER — Ambulatory Visit (INDEPENDENT_AMBULATORY_CARE_PROVIDER_SITE_OTHER): Payer: BLUE CROSS/BLUE SHIELD | Admitting: Internal Medicine

## 2016-06-18 VITALS — BP 140/80 | HR 116 | Temp 100.6°F | Wt 229.8 lb

## 2016-06-18 DIAGNOSIS — H6693 Otitis media, unspecified, bilateral: Secondary | ICD-10-CM | POA: Diagnosis not present

## 2016-06-18 DIAGNOSIS — J111 Influenza due to unidentified influenza virus with other respiratory manifestations: Secondary | ICD-10-CM | POA: Diagnosis not present

## 2016-06-18 MED ORDER — CYCLOBENZAPRINE HCL 10 MG PO TABS: 10.0000 mg | ORAL_TABLET | Freq: Every day | ORAL | 0 refills | Status: DC

## 2016-06-18 MED ORDER — LOSARTAN POTASSIUM 100 MG PO TABS: ORAL_TABLET | ORAL | 0 refills | Status: DC

## 2016-06-18 MED ORDER — BENZONATATE 100 MG PO CAPS: 100.0000 mg | ORAL_CAPSULE | Freq: Three times a day (TID) | ORAL | 0 refills | Status: DC | PRN

## 2016-06-18 NOTE — Patient Instructions (Signed)
Finish Z-Pak as directed. Tessalon Perles for cough. Rest and drink plenty of fluids. Flexeril if needed for back pain. Tylenol or Advil for fever and back pain. Out of work today and tomorrow.

## 2016-06-18 NOTE — Progress Notes (Signed)
   Subjective:    Patient ID: Terri Montes, female    DOB: 1960-06-21, 56 y.o.   MRN: [id number]  HPI  56 year old Female called emergently on Saturday, January 20 complaining of left ear pain, right upper back pain and respiratory infection symptoms with headache. Also had fever. Zithromax Z-PAK was called in for her with the understanding she would come in today for follow-up. Had flu vaccine in December.   Review of Systems     Objective:   Physical Exam Skin warm and dry. Nodes none. Pharynx very slightly injected. Right TM is full and red. Left TM is dull red retracted with splayed light reflex. Neck is supple. No adenopathy. Chest clear to auscultation. Has some point tenderness around right scapula.       Assessment & Plan:  BOM  Influenza  Plan: Finish Zithromax Z-PAK as directed. Tessalon Perles 100 mg to take 3 times daily as needed for cough. Flexeril 10 mg tablets to take one half tablet at bedtime for back pain. Tylenol or Advil for pain and fever. Rest and drink plenty of fluids. Out of work today and tomorrow.

## 2016-08-04 ENCOUNTER — Other Ambulatory Visit: Payer: Self-pay | Admitting: Internal Medicine

## 2016-11-06 ENCOUNTER — Other Ambulatory Visit: Payer: BLUE CROSS/BLUE SHIELD | Admitting: Internal Medicine

## 2016-11-08 ENCOUNTER — Ambulatory Visit: Payer: BLUE CROSS/BLUE SHIELD | Admitting: Internal Medicine

## 2016-12-03 ENCOUNTER — Ambulatory Visit: Payer: BLUE CROSS/BLUE SHIELD | Admitting: Internal Medicine

## 2016-12-27 ENCOUNTER — Encounter: Payer: Self-pay | Admitting: Internal Medicine

## 2017-01-11 ENCOUNTER — Ambulatory Visit (INDEPENDENT_AMBULATORY_CARE_PROVIDER_SITE_OTHER): Payer: BLUE CROSS/BLUE SHIELD | Admitting: Internal Medicine

## 2017-01-11 ENCOUNTER — Encounter: Payer: Self-pay | Admitting: Internal Medicine

## 2017-01-11 VITALS — BP 160/82 | HR 89 | Temp 98.9°F | Wt 239.0 lb

## 2017-01-11 DIAGNOSIS — Z6841 Body Mass Index (BMI) 40.0 and over, adult: Secondary | ICD-10-CM

## 2017-01-11 DIAGNOSIS — G43009 Migraine without aura, not intractable, without status migrainosus: Secondary | ICD-10-CM

## 2017-01-11 DIAGNOSIS — I10 Essential (primary) hypertension: Secondary | ICD-10-CM

## 2017-01-11 DIAGNOSIS — E781 Pure hyperglyceridemia: Secondary | ICD-10-CM

## 2017-01-11 MED ORDER — FUROSEMIDE 20 MG PO TABS: 20.0000 mg | ORAL_TABLET | Freq: Every day | ORAL | 1 refills | Status: DC

## 2017-01-11 NOTE — Progress Notes (Signed)
   Subjective:    Patient ID: Terri Montes, female    DOB: 01-20-1961, 56 y.o.   MRN: [id number]  HPI Onset this am of  migraine headache. Feels nauseated but has not vomited. Has visual disturbance left eye which is typical she says with her migraines. Original visit was to follow-up on essential hypertension and hyperlipidemia.  Triglycerides are elevated and HGB AIC and TSH will be done. Has gained 9 pounds. Not really exercising or watching diet. Discussion about this. Lab work done at [facility] where she is now working. She brings in lipid panel showing triglycerides of 201. Note given to have hemoglobin A1c and TSH drawn at [facility] since it is free for her.    Review of Systems see above does not want medication for migraine headache     Objective:   Physical Exam Funduscopic exam is benign and brief neurological exam shows no focal deficits. Neck supple without JVD thyromegaly or carotid bruits. Chest clear. Cardiac exam regular rate and rhythm normal S1 and S2. Extremities without edema.       Assessment & Plan:  Acute migraine headache-does not want medication today for acute migraine  Essential hypertension  Hypertriglyceridemia check TSH and hemoglobin A1c. Patient had this done at [facility].   Addendum: As of August 26 these reports have not been received from patient and she will be contacted  Obesity-needs to work on diet exercise and weight loss  Plan: Follow-up September 11

## 2017-01-19 ENCOUNTER — Other Ambulatory Visit: Payer: Self-pay | Admitting: Internal Medicine

## 2017-01-20 NOTE — Patient Instructions (Signed)
Please get strict about diet and exercise. Have TSH and hemoglobin A1c done through your work at lab. Follow-up September 11.

## 2017-02-05 ENCOUNTER — Ambulatory Visit (INDEPENDENT_AMBULATORY_CARE_PROVIDER_SITE_OTHER): Payer: BLUE CROSS/BLUE SHIELD | Admitting: Internal Medicine

## 2017-02-05 ENCOUNTER — Encounter: Payer: Self-pay | Admitting: Internal Medicine

## 2017-02-05 VITALS — BP 120/78 | HR 78 | Temp 97.5°F | Wt 229.0 lb

## 2017-02-05 DIAGNOSIS — I10 Essential (primary) hypertension: Secondary | ICD-10-CM | POA: Diagnosis not present

## 2017-02-05 DIAGNOSIS — Z6841 Body Mass Index (BMI) 40.0 and over, adult: Secondary | ICD-10-CM | POA: Diagnosis not present

## 2017-02-05 DIAGNOSIS — E781 Pure hyperglyceridemia: Secondary | ICD-10-CM | POA: Diagnosis not present

## 2017-02-13 ENCOUNTER — Other Ambulatory Visit: Payer: Self-pay | Admitting: Internal Medicine

## 2017-02-17 NOTE — Progress Notes (Signed)
   Subjective:    Patient ID: Terri Montes, female    DOB: 03/09/1961, 56 y.o.   MRN: [id number]  HPI 56 year old Female in today to follow-up on hypertension and weight issues. She brings in TSH drawn at [facility] where she works which is normal.  She recently saw joint [employer] and started exercising since her last visit. Last visit weighed 239 pounds and now weighs 229 pounds. Blood pressure is excellent at 120/78.  At last visit, her triglycerides were elevated done through [place]. Asked that due to hypertriglyceridemia that TSH be checked as well as hemoglobin A1c. She brings these in and they were within normal limits.    Review of Systems see above     Objective:   Physical Exam Neck is supple without thyromegaly. Chest clear. Cardiac exam regular rate and rhythm. Extremities without pitting edema.       Assessment & Plan:  I am glad she is joined [employer] and is exercising once again. I am pleased with her weight loss efforts. Return in January 2019 for physical examination.

## 2017-02-17 NOTE — Patient Instructions (Signed)
I am pleased with her weight loss and exercise program. Continue efforts and return in January for physical exam.

## 2017-03-15 ENCOUNTER — Other Ambulatory Visit: Payer: Self-pay | Admitting: Internal Medicine

## 2017-04-30 ENCOUNTER — Other Ambulatory Visit: Payer: Self-pay | Admitting: Internal Medicine

## 2017-05-17 DIAGNOSIS — H43811 Vitreous degeneration, right eye: Secondary | ICD-10-CM | POA: Diagnosis not present

## 2017-06-06 ENCOUNTER — Encounter: Payer: BLUE CROSS/BLUE SHIELD | Admitting: Internal Medicine

## 2017-07-31 DIAGNOSIS — E781 Pure hyperglyceridemia: Secondary | ICD-10-CM | POA: Diagnosis not present

## 2017-07-31 DIAGNOSIS — Z0000 Encounter for general adult medical examination without abnormal findings: Secondary | ICD-10-CM | POA: Diagnosis not present

## 2017-07-31 DIAGNOSIS — I10 Essential (primary) hypertension: Secondary | ICD-10-CM | POA: Diagnosis not present

## 2017-08-01 ENCOUNTER — Encounter: Payer: Self-pay | Admitting: Internal Medicine

## 2017-08-06 ENCOUNTER — Encounter: Payer: Self-pay | Admitting: Internal Medicine

## 2017-08-06 ENCOUNTER — Ambulatory Visit (INDEPENDENT_AMBULATORY_CARE_PROVIDER_SITE_OTHER): Payer: 59 | Admitting: Internal Medicine

## 2017-08-06 VITALS — BP 128/80 | HR 100 | Temp 98.6°F | Ht 64.0 in | Wt 238.0 lb

## 2017-08-06 DIAGNOSIS — E781 Pure hyperglyceridemia: Secondary | ICD-10-CM | POA: Diagnosis not present

## 2017-08-06 DIAGNOSIS — I10 Essential (primary) hypertension: Secondary | ICD-10-CM

## 2017-08-06 DIAGNOSIS — E559 Vitamin D deficiency, unspecified: Secondary | ICD-10-CM | POA: Diagnosis not present

## 2017-08-06 DIAGNOSIS — Z8669 Personal history of other diseases of the nervous system and sense organs: Secondary | ICD-10-CM | POA: Diagnosis not present

## 2017-08-06 DIAGNOSIS — Z6841 Body Mass Index (BMI) 40.0 and over, adult: Secondary | ICD-10-CM

## 2017-08-06 DIAGNOSIS — H6503 Acute serous otitis media, bilateral: Secondary | ICD-10-CM

## 2017-08-06 DIAGNOSIS — J069 Acute upper respiratory infection, unspecified: Secondary | ICD-10-CM | POA: Diagnosis not present

## 2017-08-06 DIAGNOSIS — Z0000 Encounter for general adult medical examination without abnormal findings: Secondary | ICD-10-CM

## 2017-08-06 LAB — POCT URINALYSIS DIPSTICK
Appearance: NORMAL
BILIRUBIN UA: NEGATIVE
Glucose, UA: NEGATIVE
KETONES UA: NEGATIVE
Leukocytes, UA: NEGATIVE
NITRITE UA: NEGATIVE
ODOR: NORMAL
PH UA: 7 (ref 5.0–8.0)
PROTEIN UA: NEGATIVE
RBC UA: NEGATIVE
SPEC GRAV UA: 1.01 (ref 1.010–1.025)
UROBILINOGEN UA: 0.2 U/dL

## 2017-08-06 MED ORDER — AZITHROMYCIN 250 MG PO TABS: ORAL_TABLET | ORAL | 0 refills | Status: DC

## 2017-08-06 MED ORDER — FLUCONAZOLE 150 MG PO TABS: 150.0000 mg | ORAL_TABLET | Freq: Once | ORAL | 1 refills | Status: AC

## 2017-08-06 NOTE — Progress Notes (Deleted)
   Subjective:    Patient ID: Terri Montes, female    DOB: [birth year], 57 y.o.   MRN: [id number]  HPI    Review of Systems     Objective:   Physical Exam        Assessment & Plan:

## 2017-08-06 NOTE — Progress Notes (Signed)
   Subjective:    Patient ID: Terri Montes, female    DOB: 1960-05-30, 57 y.o.   MRN: [id number]  HPI 57 year old Female in today for health maintenance exam and evaluation of medical issues.  Developed URI symptoms Thursday.  Has had sore throat.  No cough but nasal congestion.  She is working for [employer] and likes her job.  She recently became engaged to be married.  History of migraine headaches, hypertension, dependent edema, vitamin D deficiency, obesity anxiety depression.   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  Knee surgery 1987, vasovagal syncope 1997.  Septic tenosynovitis of left ankle secondary to an injury at [place] 2002 with fragmentation of a tendon requiring surgery.  Migraine headaches treated by Dr. Domingo Cocking in the past.  They seem to be stable.  MRI of the brain in 2005 was negative.  History of total abdominal hysterectomy without oophorectomy for fibroids in 2009.  C-section 1995.  2 pregnancies.  No miscarriages.  Tetanus immunization February 2011.  She is allergic to sulfa.  Imitrex causes facial numbness.  Social history: She is a widow.  Has 2 adult sons.  Family history: Mother requiring help after her father passed away of complications of lung cancer.   Review of Systems discussion regarding diet exercise and weight loss     Objective:   Physical Exam  Constitutional: She is oriented to person, place, and time. She appears well-developed and well-nourished. No distress.  HENT:  Head: Normocephalic and atraumatic.  Right Ear: External ear normal.  Left Ear: External ear normal.  Mouth/Throat: Oropharynx is clear and moist.  Eyes: Pupils are equal, round, and reactive to light. Conjunctivae and EOM are normal. Right eye exhibits no discharge. Left eye exhibits no discharge. No scleral icterus.  Neck: Neck supple. No JVD present. No thyromegaly present.  Cardiovascular: Normal rate, regular rhythm,  normal heart sounds and intact distal pulses.  No murmur heard. Pulmonary/Chest: Effort normal and breath sounds normal. No respiratory distress. She has no wheezes.  Abdominal: Soft. Bowel sounds are normal. She exhibits no distension and no mass. There is no tenderness. There is no rebound and no guarding.  Genitourinary:  Genitourinary Comments: Bimanual normal  Musculoskeletal: She exhibits no edema.  Lymphadenopathy:    She has no cervical adenopathy.  Neurological: She is alert and oriented to person, place, and time. She has normal reflexes. No cranial nerve deficit. Coordination normal.  Skin: Skin is warm and dry. No rash noted. She is not diaphoretic.  Psychiatric: She has a normal mood and affect. Her behavior is normal. Judgment and thought content normal.  Vitals reviewed.         Assessment & Plan:  Normal health maintenance exam  Essential hypertension-blood pressure stable at 128/80 on losartan  History of dependent edema-treated with Lasix 20 mg daily  BMI-40.85.  Acute bilateral serous otitis media-treat with Zithromax Z-PAK  History of anxiety depression treated with Lexapro 20 mg daily.  Plan: Continue same medications.  Take Z-Pak.  Return in 1 year or as needed.

## 2017-08-06 NOTE — Patient Instructions (Signed)
Zithromax Z-Pak take as directed.  Diflucan as needed for Candida vaginitis.  Please work on diet exercise and weight loss.  Triglycerides are elevated.  Return in 6 months for office visit blood pressure check and lipid panel to follow-up on elevated triglycerides.

## 2017-08-07 ENCOUNTER — Telehealth: Payer: Self-pay | Admitting: Internal Medicine

## 2017-08-07 NOTE — Telephone Encounter (Signed)
The amount she is taking is OK just take it regualrly

## 2017-08-07 NOTE — Telephone Encounter (Signed)
Patient is taking 3000 Vitamin D daily.  She said she told you she's taking 2 daily.  She checked to see how much is in the combination of the two.  It's 3000iu.  She just wanted to call you back to let you know because she thought it sounded like a lot.    Phone:  [phone]

## 2017-08-08 NOTE — Telephone Encounter (Signed)
Left detailed message.   

## 2017-08-12 DIAGNOSIS — H04123 Dry eye syndrome of bilateral lacrimal glands: Secondary | ICD-10-CM | POA: Diagnosis not present

## 2017-08-29 ENCOUNTER — Other Ambulatory Visit: Payer: Self-pay | Admitting: Internal Medicine

## 2017-09-06 ENCOUNTER — Other Ambulatory Visit: Payer: Self-pay | Admitting: Internal Medicine

## 2017-10-15 ENCOUNTER — Other Ambulatory Visit: Payer: Self-pay

## 2017-10-15 MED ORDER — ESCITALOPRAM OXALATE 20 MG PO TABS: 20.0000 mg | ORAL_TABLET | Freq: Every day | ORAL | 3 refills | Status: DC

## 2017-10-17 DIAGNOSIS — H18831 Recurrent erosion of cornea, right eye: Secondary | ICD-10-CM | POA: Diagnosis not present

## 2017-10-23 DIAGNOSIS — H18831 Recurrent erosion of cornea, right eye: Secondary | ICD-10-CM | POA: Diagnosis not present

## 2017-10-28 DIAGNOSIS — M1712 Unilateral primary osteoarthritis, left knee: Secondary | ICD-10-CM | POA: Diagnosis not present

## 2017-10-30 DIAGNOSIS — H18831 Recurrent erosion of cornea, right eye: Secondary | ICD-10-CM | POA: Diagnosis not present

## 2017-12-04 DIAGNOSIS — M170 Bilateral primary osteoarthritis of knee: Secondary | ICD-10-CM | POA: Diagnosis not present

## 2017-12-11 DIAGNOSIS — M170 Bilateral primary osteoarthritis of knee: Secondary | ICD-10-CM | POA: Diagnosis not present

## 2017-12-18 DIAGNOSIS — M170 Bilateral primary osteoarthritis of knee: Secondary | ICD-10-CM | POA: Diagnosis not present

## 2017-12-25 DIAGNOSIS — M170 Bilateral primary osteoarthritis of knee: Secondary | ICD-10-CM | POA: Diagnosis not present

## 2018-01-20 DIAGNOSIS — J029 Acute pharyngitis, unspecified: Secondary | ICD-10-CM | POA: Diagnosis not present

## 2018-01-20 DIAGNOSIS — J310 Chronic rhinitis: Secondary | ICD-10-CM | POA: Diagnosis not present

## 2018-01-28 ENCOUNTER — Other Ambulatory Visit: Payer: Self-pay | Admitting: Internal Medicine

## 2018-01-28 DIAGNOSIS — I10 Essential (primary) hypertension: Secondary | ICD-10-CM

## 2018-01-28 DIAGNOSIS — Z0000 Encounter for general adult medical examination without abnormal findings: Secondary | ICD-10-CM

## 2018-01-28 DIAGNOSIS — F439 Reaction to severe stress, unspecified: Secondary | ICD-10-CM

## 2018-01-28 DIAGNOSIS — E781 Pure hyperglyceridemia: Secondary | ICD-10-CM

## 2018-01-28 DIAGNOSIS — E559 Vitamin D deficiency, unspecified: Secondary | ICD-10-CM

## 2018-01-28 DIAGNOSIS — Z8669 Personal history of other diseases of the nervous system and sense organs: Secondary | ICD-10-CM

## 2018-01-28 NOTE — Addendum Note (Signed)
Addended by: Mady Haagensen on: 01/28/2018 11:33 AM   Modules accepted: Orders

## 2018-01-28 NOTE — Addendum Note (Signed)
Addended by: Mady Haagensen on: 01/28/2018 11:31 AM   Modules accepted: Orders

## 2018-01-31 DIAGNOSIS — E781 Pure hyperglyceridemia: Secondary | ICD-10-CM | POA: Diagnosis not present

## 2018-02-04 ENCOUNTER — Ambulatory Visit (INDEPENDENT_AMBULATORY_CARE_PROVIDER_SITE_OTHER): Payer: 59 | Admitting: Internal Medicine

## 2018-02-04 ENCOUNTER — Encounter: Payer: Self-pay | Admitting: Internal Medicine

## 2018-02-04 VITALS — BP 130/80 | HR 91 | Temp 98.3°F | Ht 64.0 in | Wt 242.0 lb

## 2018-02-04 DIAGNOSIS — Z6841 Body Mass Index (BMI) 40.0 and over, adult: Secondary | ICD-10-CM

## 2018-02-04 DIAGNOSIS — Z23 Encounter for immunization: Secondary | ICD-10-CM | POA: Diagnosis not present

## 2018-02-04 DIAGNOSIS — I10 Essential (primary) hypertension: Secondary | ICD-10-CM

## 2018-02-04 DIAGNOSIS — E781 Pure hyperglyceridemia: Secondary | ICD-10-CM | POA: Diagnosis not present

## 2018-02-04 DIAGNOSIS — R609 Edema, unspecified: Secondary | ICD-10-CM

## 2018-02-04 DIAGNOSIS — L304 Erythema intertrigo: Secondary | ICD-10-CM

## 2018-02-04 DIAGNOSIS — M1712 Unilateral primary osteoarthritis, left knee: Secondary | ICD-10-CM

## 2018-02-04 MED ORDER — FLUCONAZOLE 150 MG PO TABS: 150.0000 mg | ORAL_TABLET | Freq: Once | ORAL | 1 refills | Status: DC

## 2018-02-04 MED ORDER — KETOCONAZOLE 2 % EX CREA: 1.0000 "application " | TOPICAL_CREAM | Freq: Every day | CUTANEOUS | 2 refills | Status: DC

## 2018-02-04 MED ORDER — FENOFIBRATE 160 MG PO TABS: 160.0000 mg | ORAL_TABLET | Freq: Every day | ORAL | 1 refills | Status: DC

## 2018-02-04 NOTE — Patient Instructions (Addendum)
Refill Diflucan at pt request. Nizoral cream for intertrigo daily. Start fenofibrate daily.  Continue Lexapro.  Follow-up in 3 months on fenofibrate.

## 2018-02-04 NOTE — Progress Notes (Signed)
   Subjective:    Patient ID: Terri Montes, female    DOB: 01-25-1961, 57 y.o.   MRN: [id number]  HPI Here for follow up on dependent edema, anxiety and depression, hypertension as well as hyperlipidemia.  Seeing Dr. Thomasene Ripple re: vision issues. Had issues with corneas, mainly right eye treated with ointment and getting better.  Likes job at [employer].  Getting married end of September.  Recently evaluated at [facility] for left knee pain which was injected with Depo-Medrol and Marcaine.  Review of Systems patient brings in lipid panel from [facility] which shows significant hypertriglyceridemia.     Objective:   Physical Exam Neck supple.  No thyromegaly.  Chest clear to auscultation.  Cardiac exam regular rate and rhythm normal S1 and S2.  Extremities without edema.  Affect is normal. Blood pressure stable at 130/80.  Weight is 242 pounds with BMI 41.54      Assessment & Plan:  BMI 41-suggested Dr. Migdalia Dk clinic for weight loss  Essential hypertension-stable on current regimen  History of dependent edema-stable  Flu vaccine given  Osteoarthritis left knee seen by Dr. Noemi Chapel  Hypertriglyceridemia-start fenofibrate 160 mg daily.  New issue intertrigo for which Nizoral cream is been prescribed  Patient request refill on Diflucan 150 mg tablet.  For anxiety depression continue Lexapro  Dependent edema-stable  25 minutes spent with patient

## 2018-06-30 ENCOUNTER — Ambulatory Visit (INDEPENDENT_AMBULATORY_CARE_PROVIDER_SITE_OTHER): Payer: 59 | Admitting: Internal Medicine

## 2018-06-30 ENCOUNTER — Ambulatory Visit
Admission: RE | Admit: 2018-06-30 | Discharge: 2018-06-30 | Disposition: A | Discharge status: Home / Self Care | Payer: 59 | Source: Ambulatory Visit | Attending: Internal Medicine | Admitting: Internal Medicine

## 2018-06-30 ENCOUNTER — Encounter: Payer: Self-pay | Admitting: Internal Medicine

## 2018-06-30 VITALS — BP 120/80 | HR 90 | Temp 98.6°F | Ht 64.0 in | Wt 238.0 lb

## 2018-06-30 DIAGNOSIS — M533 Sacrococcygeal disorders, not elsewhere classified: Secondary | ICD-10-CM

## 2018-06-30 MED ORDER — MELOXICAM 15 MG PO TABS: 15.0000 mg | ORAL_TABLET | Freq: Every day | ORAL | 0 refills | Status: DC

## 2018-07-19 ENCOUNTER — Encounter: Payer: Self-pay | Admitting: Internal Medicine

## 2018-07-19 NOTE — Progress Notes (Signed)
   Subjective:    Patient ID: Terri Montes, female    DOB: June 04, 1960, 58 y.o.   MRN: [id number]  HPI 58 year old Female indicates that she has had rectal pain for 1 month.  On further questioning what she is having is pain in her coccyx area.  Denies any injury to the coccyx from recent or previous falls.  Sits a lot at job.  Wants to get a standing desk.  Order written.    Review of Systems see above     Objective:   Physical Exam She has palpable tenderness over her coccyx.  X-ray of sacrum and coccyx is negative     Assessment & Plan:  Coccydynia  Plan: Indicated to patient that this is a difficult condition to treat.  We can try meloxicam 15 mg daily.  She will need to use a cushion device for sitting.  Order written for standing desk.  May need to see orthopedist if symptoms persist.  15 minutes spent with patient taking history, examination, ordering x-ray and reviewing x-ray results, discussing condition with patient and making recommendations including meloxicam.  Order written for standing desk.

## 2018-07-19 NOTE — Patient Instructions (Addendum)
Order written for standing desk.  Needs cushion device for sitting while at work.  X-ray of the coccyx is negative.  Try meloxicam 15 mg daily.  May need to see orthopedist if coccyx pain does not improve

## 2018-07-31 DIAGNOSIS — L570 Actinic keratosis: Secondary | ICD-10-CM | POA: Diagnosis not present

## 2018-08-01 ENCOUNTER — Other Ambulatory Visit: Payer: Self-pay | Admitting: Internal Medicine

## 2018-08-11 ENCOUNTER — Encounter: Payer: Self-pay | Admitting: Internal Medicine

## 2018-09-29 ENCOUNTER — Other Ambulatory Visit: Payer: Self-pay | Admitting: Internal Medicine

## 2018-10-12 DIAGNOSIS — N3001 Acute cystitis with hematuria: Secondary | ICD-10-CM | POA: Diagnosis not present

## 2018-10-12 DIAGNOSIS — R300 Dysuria: Secondary | ICD-10-CM | POA: Diagnosis not present

## 2018-11-27 ENCOUNTER — Telehealth: Payer: Self-pay | Admitting: Internal Medicine

## 2018-12-01 MED ORDER — FENOFIBRATE 160 MG PO TABS: 160.0000 mg | ORAL_TABLET | Freq: Every day | ORAL | 1 refills | Status: DC

## 2018-12-01 NOTE — Telephone Encounter (Signed)
CPE scheduled for 03/02/2019

## 2018-12-01 NOTE — Addendum Note (Signed)
Addended by: Mady Haagensen on: 12/01/2018 12:08 PM   Modules accepted: Orders

## 2018-12-29 ENCOUNTER — Other Ambulatory Visit: Payer: Self-pay | Admitting: Internal Medicine

## 2019-02-26 ENCOUNTER — Other Ambulatory Visit: Payer: Self-pay | Admitting: Internal Medicine

## 2019-02-26 DIAGNOSIS — Z0000 Encounter for general adult medical examination without abnormal findings: Secondary | ICD-10-CM

## 2019-02-26 DIAGNOSIS — E781 Pure hyperglyceridemia: Secondary | ICD-10-CM

## 2019-02-26 DIAGNOSIS — E559 Vitamin D deficiency, unspecified: Secondary | ICD-10-CM

## 2019-02-26 DIAGNOSIS — I10 Essential (primary) hypertension: Secondary | ICD-10-CM

## 2019-02-26 DIAGNOSIS — F439 Reaction to severe stress, unspecified: Secondary | ICD-10-CM

## 2019-03-02 ENCOUNTER — Other Ambulatory Visit: Payer: Self-pay

## 2019-03-02 ENCOUNTER — Ambulatory Visit (INDEPENDENT_AMBULATORY_CARE_PROVIDER_SITE_OTHER): Payer: 59 | Admitting: Internal Medicine

## 2019-03-02 ENCOUNTER — Encounter: Payer: Self-pay | Admitting: Internal Medicine

## 2019-03-02 VITALS — BP 124/78 | HR 83 | Temp 97.8°F | Ht 63.0 in | Wt 235.0 lb

## 2019-03-02 DIAGNOSIS — Z8669 Personal history of other diseases of the nervous system and sense organs: Secondary | ICD-10-CM | POA: Diagnosis not present

## 2019-03-02 DIAGNOSIS — R609 Edema, unspecified: Secondary | ICD-10-CM

## 2019-03-02 DIAGNOSIS — E782 Mixed hyperlipidemia: Secondary | ICD-10-CM

## 2019-03-02 DIAGNOSIS — Z6841 Body Mass Index (BMI) 40.0 and over, adult: Secondary | ICD-10-CM | POA: Diagnosis not present

## 2019-03-02 DIAGNOSIS — Z0000 Encounter for general adult medical examination without abnormal findings: Secondary | ICD-10-CM

## 2019-03-02 DIAGNOSIS — M1712 Unilateral primary osteoarthritis, left knee: Secondary | ICD-10-CM

## 2019-03-02 DIAGNOSIS — Z23 Encounter for immunization: Secondary | ICD-10-CM | POA: Diagnosis not present

## 2019-03-02 DIAGNOSIS — I10 Essential (primary) hypertension: Secondary | ICD-10-CM

## 2019-03-02 LAB — POCT URINALYSIS DIPSTICK
Bilirubin, UA: NEGATIVE
Blood, UA: NEGATIVE
Glucose, UA: NEGATIVE
Ketones, UA: NEGATIVE
Leukocytes, UA: NEGATIVE
Nitrite, UA: NEGATIVE
Protein, UA: NEGATIVE
Spec Grav, UA: 1.01 (ref 1.010–1.025)
Urobilinogen, UA: 1 E.U./dL
pH, UA: 5 (ref 5.0–8.0)

## 2019-03-02 MED ORDER — ESCITALOPRAM OXALATE 20 MG PO TABS: 20.0000 mg | ORAL_TABLET | Freq: Every day | ORAL | 1 refills | Status: DC

## 2019-03-02 MED ORDER — LOSARTAN POTASSIUM 100 MG PO TABS: ORAL_TABLET | ORAL | 1 refills | Status: DC

## 2019-03-02 MED ORDER — ROSUVASTATIN CALCIUM 5 MG PO TABS: ORAL_TABLET | ORAL | 3 refills | Status: DC

## 2019-03-02 MED ORDER — FUROSEMIDE 20 MG PO TABS: 20.0000 mg | ORAL_TABLET | Freq: Every day | ORAL | 1 refills | Status: DC

## 2019-03-02 NOTE — Progress Notes (Signed)
   Subjective:    Patient ID: Terri Montes, female    DOB: Jan 27, 1961, 58 y.o.   MRN: [id number]  HPI 58 year old Female for health maintenance exam and evaluation of medical issues.  Stress with mother who had MI in July.  Fenofibrate seems to cause postnasal drip ?  Recommend Dr. Migdalia Dk clinic for weight loss.  She will consider it.  History of migraine headaches, hypertension, dependent edema, vitamin D deficiency obesity, anxiety and depression.  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  Right knee surgery 1987, vasovagal syncope 1997, septic tenosynovitis of left ankle secondary to an injury at [place] in 2002 with fragmentation of the tendon requiring surgery.  History of migraine headaches treated by Dr. Domingo Cocking in the past.  MRI of the brain in 2005 was negative.  History of total abdominal hysterectomy without oophorectomy for fibroids in 2009.  C-section 1995.  2 pregnancies.  No miscarriages.  Tetanus immunization February 2011.  She is allergic to sulfa.  Imitrex causes facial numbness.  Social history: She recently remarried and has 2 adult sons.  Family history: Mother requiring help status post MI after father passed away of complications of lung cancer.        Has seen Dr. Harlow Mares at [facility] regarding left knee pain. Is in PT and injected with steroid and that helped x 2 days. Using Voltaren and taking tumeric.    Review of Systems  Constitutional: Negative.   Respiratory: Negative.   Cardiovascular: Negative.   Gastrointestinal: Negative.   Genitourinary: Negative.   All other systems reviewed and are negative.      Objective:   Physical Exam Blood pressure 124/78 BMI 41.63 weight 235 pounds.  Loss of 3 pounds since March 2019.  Pulse 83 regular temperature 97.8 degrees pulse oximetry 96%.  Skin warm and dry.  Nodes none.  TMs and pharynx are clear.  Neck is supple without JVD thyromegaly or  carotid bruit.  Chest clear to auscultation.  Breast without masses.  Cardiac exam regular rate and rhythm normal S1 and S2 without murmurs.  Abdomen obese soft nondistended without hepatosplenomegaly masses or tenderness.  Bimanual normal.  Status post TAH.  Extremities without pitting edema.  Neuro no focal deficits on brief neurological exam.  Mood affect and judgment normal.       Assessment & Plan:  Flu vaccine given  Dr. Migdalia Dk clinic recommended for weight loss.  His only lost 3 pounds in the past year and her BMI is 41.63.  Encourage diet and exercise  Essential hypertension-stable on current regimen.  History of dependent edema treated with Lasix 20 mg daily  Anxiety depression treated with Lexapro 20 mg daily  Hyperlipidemia-has triglycerides 241 and LDL cholesterol 114.  Begin Crestor 5 mg 3 times a week and follow-up in January.  History of migraine headaches-under control  Primary osteoarthritis left knee seen by orthopedist  Plan: Follow-up in January with regard to hyperlipidemia.

## 2019-03-10 ENCOUNTER — Other Ambulatory Visit: Payer: Self-pay | Admitting: Internal Medicine

## 2019-03-10 NOTE — Telephone Encounter (Signed)
Please call and see why she needs this

## 2019-03-10 NOTE — Telephone Encounter (Signed)
She said she keeps this on hand, she said she gets a yeast infection every now and then and she dealt with one over the weekend. She said she gets this on her skin not vaginally.

## 2019-03-26 NOTE — Patient Instructions (Addendum)
Begin statin medication 3 times a week and follow-up in January.  Consider Dr. Migdalia Dk clinic for weight loss.  Try to get regular exercise and watch diet.  No change in antihypertensive medication.  It was a pleasure to see you today.

## 2019-05-25 ENCOUNTER — Encounter: Payer: Self-pay | Admitting: Internal Medicine

## 2019-05-29 HISTORY — PX: REPLACEMENT TOTAL KNEE: SUR1224

## 2019-06-02 ENCOUNTER — Ambulatory Visit (INDEPENDENT_AMBULATORY_CARE_PROVIDER_SITE_OTHER): Payer: 59 | Admitting: Internal Medicine

## 2019-06-02 ENCOUNTER — Other Ambulatory Visit: Payer: Self-pay

## 2019-06-02 ENCOUNTER — Encounter: Payer: Self-pay | Admitting: Internal Medicine

## 2019-06-02 VITALS — BP 130/90 | HR 82 | Temp 98.3°F | Ht 63.0 in | Wt 236.0 lb

## 2019-06-02 DIAGNOSIS — R609 Edema, unspecified: Secondary | ICD-10-CM | POA: Diagnosis not present

## 2019-06-02 DIAGNOSIS — E781 Pure hyperglyceridemia: Secondary | ICD-10-CM | POA: Diagnosis not present

## 2019-06-02 DIAGNOSIS — M1712 Unilateral primary osteoarthritis, left knee: Secondary | ICD-10-CM

## 2019-06-02 DIAGNOSIS — I10 Essential (primary) hypertension: Secondary | ICD-10-CM

## 2019-06-02 NOTE — Progress Notes (Signed)
   Subjective:    Patient ID: Terri Montes, female    DOB: 17-Jan-1961, 59 y.o.   MRN: [id number]  HPI 59 year old Female for follow up on hypertriglyceridemia.  Mother had MI in July.  Patient says fenofibrate causes postnasal drip.  History of triglyceridemia.  History of hypertension and dependent edema.  Says she is going to have to have left knee surgery in the near future.  Conservative treatment has not worked.  Diagnosed with osteoarthritis left knee.  Recent lab studies done through her employment at [employer] showed normal CBC with differential, c-Met and lipid panel.  TSH is normal at 1.950.  Vitamin D is low at 28.  In October she was started on Crestor 5 mg 3 times a week.  Remains on losartan 100 mg daily and Lasix 20 mg daily for hypertension and dependent edema.  Is on Lexapro 20 mg daily for anxiety and depression.  I previously recommended Dr. Migdalia Dk clinic for weight loss.  Had health maintenance exam in October 2020.  Sons are doing well.       Review of Systems no new complaints     Objective:   Physical Exam Weight 236 pounds. BMI 41.81 blood pressure 130/90 pulse 82 weight 236 pounds  Skin warm and dry.  Nodes none.  Neck is supple without JVD thyromegaly or carotid bruits.  Chest clear to auscultation.  Cardiac exam regular rate and rhythm normal S1 and S2 without murmurs or gallops.  Extremities without pitting edema.          Assessment & Plan:  Morbid obesity-perhaps after knee surgery she thinks she will be able to exercise more.  Left knee osteoarthritis-patient thinks she will need to have surgery in the near future  Hypertriglyceridemia -now taking Crestor 5 mg 3 times a week and lipids are within normal limits.  Says fenofibrate caused runny nose  Essential hypertension-stable on current regimen  Anxiety depression stable with Lexapro  Dependent edema stable with Lasix  Plan: Continue current regimen and follow-up in October 2021 or sooner if  needed.

## 2019-07-13 ENCOUNTER — Telehealth: Payer: Self-pay | Admitting: Internal Medicine

## 2019-07-13 NOTE — Telephone Encounter (Signed)
Faxed signed surgery clearance to [facility]- Attn: Theodora Blow - fax [phone], phone [phone] ext [phone]

## 2019-07-14 NOTE — Telephone Encounter (Signed)
Original fax did not go thru so I called and spoke with Terri Montes and got a new fax number [phone].

## 2019-07-21 ENCOUNTER — Encounter: Payer: Self-pay | Admitting: Internal Medicine

## 2019-07-21 NOTE — Patient Instructions (Signed)
Your lab results on Crestor 5 mg 3 times a week are excellent.  Continue to monitor blood pressure.  Plan to return in October 2021 for health maintenance exam.  No change in medications.  Try to watch diet.

## 2019-07-29 ENCOUNTER — Other Ambulatory Visit: Payer: Self-pay | Admitting: Internal Medicine

## 2019-08-28 ENCOUNTER — Other Ambulatory Visit: Payer: Self-pay | Admitting: Internal Medicine

## 2019-08-31 NOTE — Telephone Encounter (Signed)
Past due for 6 month recheck. Please her before refilling

## 2019-09-09 ENCOUNTER — Other Ambulatory Visit: Payer: Self-pay | Admitting: Orthopedic Surgery

## 2019-09-09 ENCOUNTER — Ambulatory Visit
Admission: RE | Admit: 2019-09-09 | Discharge: 2019-09-09 | Disposition: A | Discharge status: Home / Self Care | Payer: 59 | Source: Ambulatory Visit | Attending: Orthopedic Surgery | Admitting: Orthopedic Surgery

## 2019-09-09 ENCOUNTER — Other Ambulatory Visit: Payer: Self-pay

## 2019-09-09 DIAGNOSIS — R2242 Localized swelling, mass and lump, left lower limb: Secondary | ICD-10-CM | POA: Diagnosis present

## 2019-09-09 DIAGNOSIS — Z96652 Presence of left artificial knee joint: Secondary | ICD-10-CM | POA: Diagnosis not present

## 2019-09-23 ENCOUNTER — Other Ambulatory Visit: Payer: Self-pay | Admitting: Internal Medicine

## 2019-09-23 DIAGNOSIS — Z1231 Encounter for screening mammogram for malignant neoplasm of breast: Secondary | ICD-10-CM

## 2019-10-28 ENCOUNTER — Other Ambulatory Visit: Payer: Self-pay | Admitting: Internal Medicine

## 2019-10-28 NOTE — Telephone Encounter (Signed)
Please contact pt. Needs CPE October booked before refilling

## 2020-02-29 ENCOUNTER — Telehealth: Payer: Self-pay | Admitting: Internal Medicine

## 2020-02-29 ENCOUNTER — Other Ambulatory Visit: Payer: Self-pay | Admitting: Internal Medicine

## 2020-02-29 NOTE — Telephone Encounter (Signed)
She wanted to know what labs she needed for her CPE. Cbc, cmp, lipid, tsh, vit d.

## 2020-02-29 NOTE — Telephone Encounter (Signed)
Terri Montes would like for you to call her back about upcoming CPE, she has some lab orders and she wants to see if she needs anymore for her CPE, she would like to come by and pick them up is she needs more because she works for [employer]. Her CPE is 03/08/2020

## 2020-03-04 LAB — CBC WITH DIFFERENTIAL/PLATELET
Basophils Absolute: 0.1 10*3/uL (ref 0.0–0.2)
Basos: 1 %
EOS (ABSOLUTE): 0.1 10*3/uL (ref 0.0–0.4)
Eos: 2 %
Hematocrit: 41.2 % (ref 34.0–46.6)
Hemoglobin: 13.3 g/dL (ref 11.1–15.9)
Immature Grans (Abs): 0 10*3/uL (ref 0.0–0.1)
Immature Granulocytes: 1 %
Lymphocytes Absolute: 1.6 10*3/uL (ref 0.7–3.1)
Lymphs: 27 %
MCH: 30 pg (ref 26.6–33.0)
MCHC: 32.3 g/dL (ref 31.5–35.7)
MCV: 93 fL (ref 79–97)
Monocytes Absolute: 0.5 10*3/uL (ref 0.1–0.9)
Monocytes: 8 %
Neutrophils Absolute: 3.7 10*3/uL (ref 1.4–7.0)
Neutrophils: 61 %
Platelets: 292 10*3/uL (ref 150–450)
RBC: 4.44 x10E6/uL (ref 3.77–5.28)
RDW: 13 % (ref 11.7–15.4)
WBC: 6 10*3/uL (ref 3.4–10.8)

## 2020-03-04 LAB — COMPREHENSIVE METABOLIC PANEL
ALT: 11 IU/L (ref 0–32)
AST: 17 IU/L (ref 0–40)
Albumin/Globulin Ratio: 1.7 (ref 1.2–2.2)
Albumin: 4.6 g/dL (ref 3.8–4.9)
Alkaline Phosphatase: 113 IU/L (ref 44–121)
BUN/Creatinine Ratio: 19 (ref 9–23)
BUN: 18 mg/dL (ref 6–24)
Bilirubin Total: 0.4 mg/dL (ref 0.0–1.2)
CO2: 25 mmol/L (ref 20–29)
Calcium: 9.6 mg/dL (ref 8.7–10.2)
Chloride: 105 mmol/L (ref 96–106)
Creatinine, Ser: 0.94 mg/dL (ref 0.57–1.00)
GFR calc Af Amer: 77 mL/min/{1.73_m2} (ref >59)
GFR calc non Af Amer: 67 mL/min/{1.73_m2} (ref >59)
Globulin, Total: 2.7 g/dL (ref 1.5–4.5)
Glucose: 94 mg/dL (ref 65–99)
Potassium: 4.4 mmol/L (ref 3.5–5.2)
Sodium: 143 mmol/L (ref 134–144)
Total Protein: 7.3 g/dL (ref 6.0–8.5)

## 2020-03-04 LAB — LIPID PANEL
Chol/HDL Ratio: 3.3 ratio (ref 0.0–4.4)
Cholesterol, Total: 149 mg/dL (ref 100–199)
HDL: 45 mg/dL (ref >39)
LDL Chol Calc (NIH): 79 mg/dL (ref 0–99)
Triglycerides: 140 mg/dL (ref 0–149)
VLDL Cholesterol Cal: 25 mg/dL (ref 5–40)

## 2020-03-04 LAB — VITAMIN D 25 HYDROXY (VIT D DEFICIENCY, FRACTURES): Vit D, 25-Hydroxy: 26 ng/mL — ABNORMAL LOW (ref 30.0–100.0)

## 2020-03-04 LAB — TSH: TSH: 1.67 u[IU]/mL (ref 0.450–4.500)

## 2020-03-08 ENCOUNTER — Other Ambulatory Visit: Payer: Self-pay

## 2020-03-08 ENCOUNTER — Ambulatory Visit (INDEPENDENT_AMBULATORY_CARE_PROVIDER_SITE_OTHER): Payer: 59 | Admitting: Internal Medicine

## 2020-03-08 ENCOUNTER — Encounter: Payer: Self-pay | Admitting: Internal Medicine

## 2020-03-08 VITALS — BP 120/80 | HR 83 | Ht 63.0 in | Wt 234.0 lb

## 2020-03-08 DIAGNOSIS — E559 Vitamin D deficiency, unspecified: Secondary | ICD-10-CM

## 2020-03-08 DIAGNOSIS — Z0000 Encounter for general adult medical examination without abnormal findings: Secondary | ICD-10-CM

## 2020-03-08 DIAGNOSIS — E782 Mixed hyperlipidemia: Secondary | ICD-10-CM

## 2020-03-08 DIAGNOSIS — Z23 Encounter for immunization: Secondary | ICD-10-CM

## 2020-03-08 DIAGNOSIS — Z6834 Body mass index (BMI) 34.0-34.9, adult: Secondary | ICD-10-CM | POA: Diagnosis not present

## 2020-03-08 DIAGNOSIS — Z96652 Presence of left artificial knee joint: Secondary | ICD-10-CM

## 2020-03-08 DIAGNOSIS — R609 Edema, unspecified: Secondary | ICD-10-CM

## 2020-03-08 DIAGNOSIS — I10 Essential (primary) hypertension: Secondary | ICD-10-CM | POA: Diagnosis not present

## 2020-03-08 DIAGNOSIS — Z8669 Personal history of other diseases of the nervous system and sense organs: Secondary | ICD-10-CM

## 2020-03-08 LAB — POCT URINALYSIS DIPSTICK
Appearance: NEGATIVE
Bilirubin, UA: NEGATIVE
Blood, UA: NEGATIVE
Glucose, UA: NEGATIVE
Ketones, UA: NEGATIVE
Leukocytes, UA: NEGATIVE
Nitrite, UA: NEGATIVE
Odor: NEGATIVE
Protein, UA: NEGATIVE
Spec Grav, UA: 1.01 (ref 1.010–1.025)
Urobilinogen, UA: 0.2 E.U./dL
pH, UA: 7.5 (ref 5.0–8.0)

## 2020-03-08 NOTE — Progress Notes (Signed)
   Subjective:    Patient ID: Terri Montes, female    DOB: 1961-05-20, 59 y.o.   MRN: [id number]  HPI  59 year old Female for health maintenance exam and evaluation of medical issues.  Had arthroplasty of left knee by Dr. Harlow Mares in [place] in April 2021.  Lab work reviewed from [employer] where she is employed.  CBC is normal.  C-Met is normal.  Lipid panel is normal.  Vitamin D level is low at 26.  TSH is normal.  History of cesarean section 1995.  Knee arthroscopy 1987.  Cesarean section 1995.  Hysterectomy 2010.  Social history: Continues to work for [employer].  She is married.  2 adult sons from first marriage.  Elderly mother had MI July 2020.  History of migraine headaches, hypertension, dependent edema, vitamin D deficiency, obesity, anxiety and depression.  History of keratoconus followed by ophthalmologist.  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  Right knee surgery 1987, vasovagal syncope in 1997.  Septic tenosynovitis of left ankle secondary to an injury at [place] in 2002 with fragmentation of the tendon requiring surgery.  History of total abdominal hysterectomy without oophorectomy for fibroids in 2009.  2 pregnancies and no miscarriages.  She is allergic to sulfa.  Imitrex causes facial numbness.  Family history: Father passed away of complications of lung cancer.  Mother is status post an MI  Had colonoscopy in 2016 at [place] and 10-year follow-up recommended.  Have ordered mammogram.  Has had 2 COVID-19 vaccines.  Tetanus immunization is up-to-date.  Flu vaccine received.  Review of Systems  Respiratory: Negative.   Cardiovascular: Negative.   Gastrointestinal: Negative.   Genitourinary: Negative.   Neurological: Negative.        Objective:   Physical Exam Vitals reviewed.    BMI 41.45 weight 234 pounds  Skin warm and dry.  Nodes none.  TMs are clear.  Neck is supple without JVD thyromegaly or carotid bruits.  Chest is clear to auscultation.  Breasts: Normal  female without masses.  Cardiac exam: Regular rate and rhythm normal S1 and S2 without murmurs.  Abdomen: Soft nondistended without hepatosplenomegaly masses or tenderness.  Bimanual is normal.  Status post TAH.  No lower extremity pitting edema.  Neuro is intact without focal deficits on brief neurological exam.  Mood affect thought and judgment are normal.       Assessment & Plan:  Vitamin D deficiency-recommend high-dose vitamin D 50,000 units weekly for 12 weeks then take 5000 units daily of vitamin D3  History of dependent edema stable on Lasix 20 mg daily  History of anxiety depression treated with Lexapro 20 mg daily and stable  Essential hypertension treated with losartan and stable  Hyperlipidemia-currently taking Crestor 5 mg 3 times a week and lipids are within normal limits  Status post left knee arthroplasty and doing well  History of migraine headaches-improved  BMI 34-may want to consider Dr. Migdalia Dk clinic.  She will work on diet and exercise in the upcoming months.  Plan: Return in 1 year or as needed and continue current medications.  Flu vaccine given.  Has had 2 COVID-19 immunizations.

## 2020-03-09 MED ORDER — ERGOCALCIFEROL 1.25 MG (50000 UT) PO CAPS: 50000.0000 [IU] | ORAL_CAPSULE | ORAL | 0 refills | Status: DC

## 2020-03-25 ENCOUNTER — Other Ambulatory Visit: Payer: Self-pay | Admitting: Internal Medicine

## 2020-03-27 NOTE — Patient Instructions (Signed)
It was a pleasure to see you today.  Continue Crestor for hyperlipidemia and losartan for hypertension.  Continue Lasix for dependent edema.  Take high-dose vitamin D weekly for 12 weeks then take 5000 units daily of vitamin D3 over-the-counter.  Continue to work on diet and exercise issues.  Return in 1 year or as needed.  Flu vaccine given.

## 2020-04-26 ENCOUNTER — Other Ambulatory Visit: Payer: Self-pay | Admitting: Internal Medicine

## 2020-06-13 ENCOUNTER — Other Ambulatory Visit: Payer: Self-pay | Admitting: Internal Medicine

## 2020-07-16 ENCOUNTER — Other Ambulatory Visit: Payer: Self-pay | Admitting: Internal Medicine

## 2020-09-14 ENCOUNTER — Other Ambulatory Visit: Payer: Self-pay

## 2020-09-14 MED ORDER — ESCITALOPRAM OXALATE 20 MG PO TABS: 1.0000 | ORAL_TABLET | Freq: Every day | ORAL | 1 refills | Status: DC

## 2020-09-14 MED ORDER — ROSUVASTATIN CALCIUM 5 MG PO TABS: ORAL_TABLET | ORAL | 1 refills | Status: DC

## 2020-09-14 MED ORDER — LOSARTAN POTASSIUM 100 MG PO TABS: ORAL_TABLET | ORAL | 1 refills | Status: DC

## 2020-11-09 ENCOUNTER — Other Ambulatory Visit: Payer: Self-pay

## 2020-11-09 MED ORDER — ROSUVASTATIN CALCIUM 5 MG PO TABS: ORAL_TABLET | ORAL | 1 refills | Status: DC

## 2021-01-18 ENCOUNTER — Other Ambulatory Visit: Payer: Self-pay | Admitting: Internal Medicine

## 2021-01-30 ENCOUNTER — Other Ambulatory Visit: Payer: Self-pay | Admitting: Internal Medicine

## 2021-02-24 ENCOUNTER — Telehealth: Payer: Self-pay | Admitting: Internal Medicine

## 2021-02-24 NOTE — Telephone Encounter (Signed)
Terri Montes [phone]  Asa Lente called and needs her lab orders mailed to her. She gets her labs done at [facility], could you put these orders in and mail them to her?

## 2021-02-27 ENCOUNTER — Other Ambulatory Visit: Payer: Self-pay

## 2021-02-27 DIAGNOSIS — E559 Vitamin D deficiency, unspecified: Secondary | ICD-10-CM

## 2021-02-27 DIAGNOSIS — I10 Essential (primary) hypertension: Secondary | ICD-10-CM

## 2021-02-27 DIAGNOSIS — E782 Mixed hyperlipidemia: Secondary | ICD-10-CM

## 2021-02-27 DIAGNOSIS — Z0000 Encounter for general adult medical examination without abnormal findings: Secondary | ICD-10-CM

## 2021-02-27 DIAGNOSIS — Z6841 Body Mass Index (BMI) 40.0 and over, adult: Secondary | ICD-10-CM

## 2021-02-27 NOTE — Telephone Encounter (Signed)
Lab orders printed and mailed to patient. She has been notified as well.

## 2021-03-08 ENCOUNTER — Encounter: Payer: Self-pay | Admitting: Internal Medicine

## 2021-03-13 ENCOUNTER — Encounter: Payer: Self-pay | Admitting: Internal Medicine

## 2021-03-13 ENCOUNTER — Ambulatory Visit (INDEPENDENT_AMBULATORY_CARE_PROVIDER_SITE_OTHER): Payer: 59 | Admitting: Internal Medicine

## 2021-03-13 ENCOUNTER — Other Ambulatory Visit: Payer: Self-pay

## 2021-03-13 VITALS — BP 140/80 | HR 96 | Temp 97.9°F | Ht 63.0 in | Wt 237.0 lb

## 2021-03-13 DIAGNOSIS — Z1231 Encounter for screening mammogram for malignant neoplasm of breast: Secondary | ICD-10-CM

## 2021-03-13 DIAGNOSIS — R609 Edema, unspecified: Secondary | ICD-10-CM

## 2021-03-13 DIAGNOSIS — I10 Essential (primary) hypertension: Secondary | ICD-10-CM

## 2021-03-13 DIAGNOSIS — Z8669 Personal history of other diseases of the nervous system and sense organs: Secondary | ICD-10-CM

## 2021-03-13 DIAGNOSIS — Z0000 Encounter for general adult medical examination without abnormal findings: Secondary | ICD-10-CM | POA: Diagnosis not present

## 2021-03-13 DIAGNOSIS — F411 Generalized anxiety disorder: Secondary | ICD-10-CM

## 2021-03-13 DIAGNOSIS — E781 Pure hyperglyceridemia: Secondary | ICD-10-CM

## 2021-03-13 DIAGNOSIS — E782 Mixed hyperlipidemia: Secondary | ICD-10-CM | POA: Diagnosis not present

## 2021-03-13 DIAGNOSIS — F439 Reaction to severe stress, unspecified: Secondary | ICD-10-CM

## 2021-03-13 DIAGNOSIS — Z23 Encounter for immunization: Secondary | ICD-10-CM | POA: Diagnosis not present

## 2021-03-13 DIAGNOSIS — Z6841 Body Mass Index (BMI) 40.0 and over, adult: Secondary | ICD-10-CM

## 2021-03-13 DIAGNOSIS — Z96652 Presence of left artificial knee joint: Secondary | ICD-10-CM | POA: Diagnosis not present

## 2021-03-13 LAB — POCT URINALYSIS DIPSTICK
Bilirubin, UA: NEGATIVE
Blood, UA: NEGATIVE
Glucose, UA: NEGATIVE
Ketones, UA: NEGATIVE
Leukocytes, UA: NEGATIVE
Nitrite, UA: NEGATIVE
Protein, UA: NEGATIVE
Spec Grav, UA: 1.01 (ref 1.010–1.025)
Urobilinogen, UA: NEGATIVE E.U./dL — AB
pH, UA: 6 (ref 5.0–8.0)

## 2021-03-13 MED ORDER — ROSUVASTATIN CALCIUM 10 MG PO TABS
10.0000 mg | ORAL_TABLET | Freq: Every day | ORAL | 3 refills | Status: DC
Start: 2021-03-13 — End: 2022-03-28

## 2021-03-13 MED ORDER — ALPRAZOLAM 0.5 MG PO TABS: 0.5000 mg | ORAL_TABLET | Freq: Two times a day (BID) | ORAL | 0 refills | Status: AC | PRN

## 2021-03-13 NOTE — Progress Notes (Signed)
   Subjective:    Patient ID: Terri Montes, female    DOB: May 15, 1961, 60 y.o.   MRN: [id number]  HPI 60 year old Female seen for health maintenance exam and evaluation of medical issues.  Work is stressful.Son got married recently. [employer] was stressful.  Has new granddaughter almost a year old. Mother died in 2022-06-17.  Mother ad hiatal hernia and  underwent surgery. Did not do well after surgery. Had heart issues.  Mother had history of heart disease and succumbed to heart failure. Patient says she is getting along well with her brothers with settling estate.  Still working for [employer].  She  says she might look for another job at [employer] work from home. Working from home now.  History of left knee arthroplasty by Dr. Harlow Mares in [place] in April 2021.  Had cesarean section 1995, knee arthroscopy in 1987, 2 pregnancies.  No miscarriages.  Total abdominal hysterectomy without oophorectomy for fibroids in 2009.  Septic tenosynovitis of left ankle secondary to an injury at [place] in 2002 with fragmentation of the tendon requiring surgery.  Vasovagal syncope in 1997.  History of migraine headaches treated by Dr. Domingo Cocking in the past.  MRI of the brain in 2005 was negative.  She is allergic to sulfa.  Imitrex causes facial numbness.  Social history: She is married.  2 adult sons from previous marriage.  Family history: Father passed away from complications of lung cancer.  Mother passed away from heart failure recently.     Review of Systems seems mildly depressed perhaps because her son recently got married and wedding was stressful     Objective:   Physical Exam  BP 148/72, Pulse 96 regular, T. 97.9 Weight 237 pounds.  BMI 41.98  Skin: Warm and dry.  No cervical adenopathy.  No thyromegaly.  Chest is clear to auscultation.  Cardiac exam: Regular rate and rhythm.  Abdomen is soft ,nondistended without hepatosplenomegaly masses or tenderness.  No lower extremity pitting edema.   Neurological exam is intact without focal deficits.  Patient having anxiety which was discussed today and is associated with situational stress      Assessment & Plan:  BMI 41- Suggest [facility]. Info given     Elevated BP- recheck in 4 weeks- ? Situational stress  Hyperlipidemia- increase Crestor to 10 mg daily and follow up in 3-4 months  Situational stress- needs counseling and maybe med consult. Recommended 2 offices here in [place].  Prescribe Xanax  Health maintenance- flu vaccine given Colonoscopy up to date.  Was done in 2016 and due again in  2026.   Anxiety state-Xanax 0.5 mg twice daily as needed for anxiety until can see Psychologist  Essential hypertension-stable on current regimen  History of arthroplasty left knee  Dependent edema treated with diuretic  Need for flu vaccine  History of migraine headaches  Plan: Return for repeat blood pressure check in 4 weeks.  Repeat lipid panel with office visit in 3 to 4 months.  Flu vaccine given.

## 2021-04-22 NOTE — Patient Instructions (Addendum)
Return in 4 weeks for blood pressure check.  Return for lipid panel fasting in 3 to 4 months.  Flu vaccine given.  Was a pleasure to see you today.  Order for mammogram placed.  Xanax prescribed to take sparingly twice daily as needed for anxiety to take sparingly twice daily as needed for anxiety.

## 2021-04-25 ENCOUNTER — Other Ambulatory Visit: Payer: Self-pay | Admitting: Internal Medicine

## 2021-05-12 ENCOUNTER — Other Ambulatory Visit: Payer: Self-pay

## 2021-05-12 MED ORDER — KETOCONAZOLE 2 % EX CREA: 1.0000 "application " | TOPICAL_CREAM | Freq: Every day | CUTANEOUS | 2 refills | Status: AC

## 2021-05-12 NOTE — Telephone Encounter (Signed)
Patient is asking for diflucan to help treat her yeast infection under her stomach flap. She is also asking for a refill on the cream.

## 2021-05-12 NOTE — Telephone Encounter (Signed)
Patient notified

## 2021-06-26 ENCOUNTER — Ambulatory Visit: Payer: 59 | Admitting: Internal Medicine

## 2021-07-19 ENCOUNTER — Other Ambulatory Visit: Payer: Self-pay

## 2021-07-19 ENCOUNTER — Telehealth: Payer: Self-pay | Admitting: Internal Medicine

## 2021-07-19 DIAGNOSIS — E782 Mixed hyperlipidemia: Secondary | ICD-10-CM

## 2021-07-19 NOTE — Telephone Encounter (Signed)
Will you mail lab orders to her today?

## 2021-07-19 NOTE — Telephone Encounter (Signed)
Mailed

## 2021-07-21 ENCOUNTER — Ambulatory Visit: Payer: Managed Care, Other (non HMO) | Admitting: Internal Medicine

## 2021-07-28 ENCOUNTER — Ambulatory Visit: Payer: Managed Care, Other (non HMO) | Admitting: Internal Medicine

## 2021-07-28 ENCOUNTER — Encounter: Payer: Self-pay | Admitting: Internal Medicine

## 2021-07-28 ENCOUNTER — Other Ambulatory Visit: Payer: Self-pay

## 2021-07-28 VITALS — BP 138/82 | HR 99 | Temp 98.3°F | Ht 63.0 in | Wt 238.5 lb

## 2021-07-28 DIAGNOSIS — E782 Mixed hyperlipidemia: Secondary | ICD-10-CM

## 2021-07-28 DIAGNOSIS — Z6841 Body Mass Index (BMI) 40.0 and over, adult: Secondary | ICD-10-CM | POA: Diagnosis not present

## 2021-07-28 DIAGNOSIS — R609 Edema, unspecified: Secondary | ICD-10-CM

## 2021-07-28 DIAGNOSIS — I10 Essential (primary) hypertension: Secondary | ICD-10-CM | POA: Diagnosis not present

## 2021-07-28 MED ORDER — OLMESARTAN MEDOXOMIL 20 MG PO TABS: 20.0000 mg | ORAL_TABLET | Freq: Every day | ORAL | 2 refills | Status: DC

## 2021-07-28 NOTE — Patient Instructions (Addendum)
Discontinue losartan.  Try Benicar 20 mg daily and call with blood pressure readings in a couple of weeks.  Health maintenance exam booked for October.  Continue Crestor 10 mg daily as lipid panel is excellent.  Health maintenance exam due October 2023. ?

## 2021-07-28 NOTE — Progress Notes (Unsigned)
° °  Subjective:    Patient ID: Terri Montes, female    DOB: 08-03-1960, 61 y.o.   MRN: [id number]  HPI 61 year old Female seen for follow up on hyperlipidemia.  She was here in October and triglycerides were elevated at 199.  Total cholesterol 177, HDL cholesterol was 44 and LDL cholesterol was 99.  Previously had been on Crestor 5 mg 3 times a week and is now on Crestor generic 10 mg daily.  She had repeat lipid panel and lipid panel and liver functions are within normal limits.  With regard to blood pressure she is currently on losartan 100 mg daily.  Blood pressures been elevated at times systolically.  We are going to try instead Benicar 20 mg daily and she will discontinue losartan.  She will monitor blood pressures at home and give me a call with the readings in a couple of weeks.  Her health maintenance exam is due in October.     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  Situational stress at times with job.  She would like to have more time with family.     Objective:   Physical Exam Blood pressure 138/82 pulse 99 temperature 98.3 degrees pulse oximetry 96% weight 238 pounds 8 ounces BMI 42.25.  Previous weight in October was 237. Neck is supple without JVD thyromegaly or carotid bruits.  Chest clear to auscultation.  Cardiac exam: Regular rate and rhythm without ectopy.  No significant lower extremity pitting edema.      Assessment & Plan:  Essential hypertension blood pressure has been elevated systolically at home and today it is 138/82.  Ideally it should be 110-120/80.  Dependent edema continue Lasix 20 mg daily as needed for dependent edema.  BMI 42.25.  Needs to work on diet exercise and weight loss.  Hyperlipidemia-lipid panel liver functions normal on Crestor 10 mg daily.  Labs are drawn through [employer] , which is her  employer  Plan: She will change to olmesartan 20 mg daily and  call me in a couple of weeks with some blood pressure readings.  Health maintenance exam booked for October.  She will discontinue losartan.  Continue Crestor 10 mg daily as lipid panel is excellent.

## 2021-08-24 ENCOUNTER — Other Ambulatory Visit: Payer: Self-pay | Admitting: Internal Medicine

## 2021-08-24 ENCOUNTER — Telehealth: Payer: Self-pay

## 2021-08-24 ENCOUNTER — Encounter: Payer: Self-pay | Admitting: Internal Medicine

## 2021-08-24 DIAGNOSIS — Z1231 Encounter for screening mammogram for malignant neoplasm of breast: Secondary | ICD-10-CM

## 2021-08-24 NOTE — Telephone Encounter (Signed)
Spoke with patient in regards to the olmesartan. She states that it has remained about the same as when she was on losartan. She states 140-145/80s for the most part. Yesterday was 138/75 and to day was 143/75. She denies blurry vision, shortness of breath, chest pain but states that she does have slight headache. Tylenol and/or caffeine usually takes care of that. She does have a file on her computer she will try to upload today.  ?

## 2021-08-31 ENCOUNTER — Ambulatory Visit
Admission: RE | Admit: 2021-08-31 | Discharge: 2021-08-31 | Disposition: A | Discharge status: Home / Self Care | Payer: Managed Care, Other (non HMO) | Source: Ambulatory Visit

## 2021-08-31 DIAGNOSIS — Z1231 Encounter for screening mammogram for malignant neoplasm of breast: Secondary | ICD-10-CM

## 2021-09-04 ENCOUNTER — Other Ambulatory Visit: Payer: Self-pay | Admitting: Internal Medicine

## 2021-09-04 DIAGNOSIS — R928 Other abnormal and inconclusive findings on diagnostic imaging of breast: Secondary | ICD-10-CM

## 2021-09-12 ENCOUNTER — Other Ambulatory Visit: Payer: Self-pay

## 2021-09-12 MED ORDER — OLMESARTAN MEDOXOMIL 20 MG PO TABS: 20.0000 mg | ORAL_TABLET | Freq: Every day | ORAL | 1 refills | Status: DC

## 2021-09-18 ENCOUNTER — Ambulatory Visit
Admission: RE | Admit: 2021-09-18 | Discharge: 2021-09-18 | Disposition: A | Discharge status: Home / Self Care | Payer: Managed Care, Other (non HMO) | Source: Ambulatory Visit | Attending: Internal Medicine | Admitting: Internal Medicine

## 2021-09-18 ENCOUNTER — Other Ambulatory Visit: Payer: Self-pay | Admitting: Internal Medicine

## 2021-09-18 DIAGNOSIS — R928 Other abnormal and inconclusive findings on diagnostic imaging of breast: Secondary | ICD-10-CM

## 2021-09-18 DIAGNOSIS — N6310 Unspecified lump in the right breast, unspecified quadrant: Secondary | ICD-10-CM

## 2021-09-21 ENCOUNTER — Other Ambulatory Visit: Payer: Self-pay | Admitting: Internal Medicine

## 2021-09-22 ENCOUNTER — Ambulatory Visit
Admission: RE | Admit: 2021-09-22 | Discharge: 2021-09-22 | Disposition: A | Discharge status: Home / Self Care | Payer: Managed Care, Other (non HMO) | Source: Ambulatory Visit | Attending: Internal Medicine | Admitting: Internal Medicine

## 2021-09-22 DIAGNOSIS — N6310 Unspecified lump in the right breast, unspecified quadrant: Secondary | ICD-10-CM

## 2021-10-01 NOTE — Progress Notes (Signed)
?Radiation Oncology         [phone] ?________________________________ ? ?Name: Terri Montes        MRN: [id number]  ?Date of Service: 10/04/2021 DOB: September 10, 1960 ? ?[id number], Cresenciano Lick, MD  Coralie Keens, MD    ? ?REFERRING PHYSICIAN: Coralie Keens, MD ? ? ?DIAGNOSIS: The encounter diagnosis was Malignant neoplasm of upper-inner quadrant of right breast in female, estrogen receptor positive ([place]). ? ? ?HISTORY OF PRESENT ILLNESS: Terri Montes is a 61 y.o. female seen in the multidisciplinary breast clinic for a new diagnosis of right breast cancer. The patient was noted to have a screening detected asymmetry of the right breast.  Of note she has a history of a benign papilloma resected in 2002.  Her right breast asymmetry persisted on diagnostic work-up measuring up to 9 mm in the 1 o'clock position by ultrasound.  Her right axilla was negative for adenopathy.  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  Her Ki-67 is less than 5%.  She is seen to discuss treatment recommendations of her cancer.. ? ?Patient is accompanied by her husband. They report some familiarity with radiation. The patient reports her father had 6-8 weeks of radiation here at the [facility] and that it was somewhat difficult for him, especially near the end. Patient's husband reports his father had 61 daily radiation treatments for prostate cancer.  ? ?Patient works from home and is interested in continuing to work through treatment. ? ?PREVIOUS RADIATION THERAPY: No ? ? ?PAST MEDICAL HISTORY:  ?Past Medical History:  ?Diagnosis Date  ? Allergy   ? seasonal  ? Anxiety   ? Depression   ? Hypertension   ?   ? ? ?PAST SURGICAL HISTORY: ?Past Surgical History:  ?Procedure Laterality Date  ? ABDOMINAL HYSTERECTOMY  03/28/2010  ? partial  ? BREAST BIOPSY Right 2002  ? benign  ? BREAST SURGERY Right 05/28/2000   ? benign breast biopsy  ? CESAREAN SECTION  04/27/1994  ? REPAIR PERONEAL TENDONS ANKLE Bilateral 2003, 2006  ? ? ? ?FAMILY HISTORY:  ?Family History  ?Problem Relation Age of Onset  ? Hypertension Mother   ? Lung cancer Father 50  ? Hypertension Brother   ? Colon cancer Neg Hx   ? Breast cancer Neg Hx   ? ? ? ?SOCIAL HISTORY:  reports that she has never smoked. She has never used smokeless tobacco. She reports that she does not drink alcohol and does not use drugs. The patient is married and lives in [place]. She works at [employer].  ? ? ?ALLERGIES: Sulfa antibiotics ? ? ?MEDICATIONS:  ?Current Outpatient Medications  ?Medication Sig Dispense Refill  ? acetaminophen (TYLENOL) 325 MG tablet Take 650 mg by mouth every 6 (six) hours as needed for pain.    ? ALPRAZolam (XANAX) 0.5 MG tablet Take 1 tablet (0.5 mg total) by mouth 2 (two) times daily as needed for anxiety. 60 tablet 0  ? escitalopram (LEXAPRO) 20 MG tablet TAKE 1 TABLET BY MOUTH  DAILY 90 tablet 3  ? furosemide (LASIX) 20 MG tablet TAKE ONE TABLET EVERY DAY (Patient taking differently: Take 20 mg by mouth as needed.) 90 tablet 1  ? ibuprofen (ADVIL,MOTRIN) 800 MG tablet Take 1 tablet (800 mg total) by mouth every 8 (eight) hours as needed. 30 tablet 0  ? ketoconazole (NIZORAL) 2 % cream Apply 1  application topically daily. 60 g 2  ? ketotifen (ZADITOR) 0.025 % ophthalmic solution 1 drop 2 (two) times daily. Reported on 05/12/2015    ? mupirocin ointment (BACTROBAN) 2 % Apply to nostrils at bedtime x 3 weeks 22 g 0  ? olmesartan (BENICAR) 20 MG tablet Take 1 tablet (20 mg total) by mouth daily. 90 tablet 1  ? rosuvastatin (CRESTOR) 10 MG tablet Take 1 tablet (10 mg total) by mouth daily. 90 tablet 3  ? ?No current facility-administered medications for this encounter.  ? ? ? ?REVIEW OF SYSTEMS: On review of systems, the patient reports that she is doing well without any systemic symptoms or major concerns ?  ? ?PHYSICAL EXAM:  ?Wt Readings from Last 3  Encounters:  ?07/28/21 238 lb 8 oz (108.2 kg)  ?03/13/21 237 lb (107.5 kg)  ?03/08/20 234 lb (106.1 kg)  ? ?Temp Readings from Last 3 Encounters:  ?07/28/21 98.3 ?F (36.8 ?C) (Tympanic)  ?03/13/21 97.9 ?F (36.6 ?C) (Tympanic)  ?06/02/19 98.3 ?F (36.8 ?C)  ? ?BP Readings from Last 3 Encounters:  ?07/28/21 138/82  ?03/13/21 140/80  ?03/08/20 120/80  ? ?Pulse Readings from Last 3 Encounters:  ?07/28/21 99  ?03/13/21 96  ?03/08/20 83  ? ? ?In general this is a well appearing Caucasian female in no acute distress. She's alert and oriented x4 and appropriate throughout the examination. Cardiopulmonary assessment is negative for acute distress and she exhibits normal effort. Bilateral breast exam is deferred. ? ? ? ?ECOG = 0 ? ?0 - Asymptomatic (Fully active, able to carry on all predisease activities without restriction) ? ?1 - Symptomatic but completely ambulatory (Restricted in physically strenuous activity but ambulatory and able to carry out work of a light or sedentary nature. For example, light housework, office work) ? ?2 - Symptomatic, <50% in bed during the day (Ambulatory and capable of all self care but unable to carry out any work activities. Up and about more than 50% of waking hours) ? ?3 - Symptomatic, >50% in bed, but not bedbound (Capable of only limited self-care, confined to bed or chair 50% or more of waking hours) ? ?4 - Bedbound (Completely disabled. Cannot carry on any self-care. Totally confined to bed or chair) ? ?5 - Death ? ? Oken MM, Creech RH, Tormey DC, et al. [phone]). "Toxicity and response criteria of the [facility]". [place]in Oncol. 5 (6): 649-55 ? ? ? ?LABORATORY DATA:  ?Lab Results  ?Component Value Date  ? WBC 6.0 03/03/2020  ? HGB 13.3 03/03/2020  ? HCT 41.2 03/03/2020  ? MCV 93 03/03/2020  ? PLT 292 03/03/2020  ? ?Lab Results  ?Component Value Date  ? NA 143 03/03/2020  ? K 4.4 03/03/2020  ? CL 105 03/03/2020  ? CO2 25 03/03/2020  ? ?Lab Results  ?Component  Value Date  ? ALT 11 03/03/2020  ? AST 17 03/03/2020  ? ALKPHOS 113 03/03/2020  ? BILITOT 0.4 03/03/2020  ? ?  ? ?RADIOGRAPHY: US BREAST LTD UNI RIGHT INC AXILLA ? ?Result Date: 09/18/2021 ?CLINICAL DATA:  Patient returns after screening study for evaluation of possible RIGHT breast asymmetry. EXAM: DIGITAL DIAGNOSTIC UNILATERAL RIGHT MAMMOGRAM WITH TOMOSYNTHESIS AND CAD; ULTRASOUND RIGHT BREAST LIMITED TECHNIQUE: Right digital diagnostic mammography and breast tomosynthesis was performed. The images were evaluated with computer-aided detection.; Targeted ultrasound examination of the right breast was performed COMPARISON:  09/18/2021 and earlier ACR Breast Density Category b: There are scattered areas of fibroglandular density. FINDINGS:  Additional 2-D and 3-D images are performed. These views confirm presence of an irregular mass with indistinct or possibly spiculated margins in the UPPER INNER QUADRANT of the RIGHT breast. Targeted ultrasound is performed, showing an irregular hypoechoic mass in the 1 o'clock location of the RIGHT breast 6 centimeters from the nipple. Mass has angular margins and internal blood flow and measures 0.5 x 0.9 x 0.5 centimeters. Lesion is taller than wide. Evaluation of the RIGHT axilla is negative for adenopathy. IMPRESSION: Indeterminate mass in the 1 o'clock location of the RIGHT breast. RECOMMENDATION: Ultrasound-guided core biopsy of RIGHT breast mass. I have discussed the findings and recommendations with the patient. If applicable, a reminder letter will be sent to the patient regarding the next appointment. BI-RADS CATEGORY  4: Suspicious. Electronically Signed   By: Nolon Nations M.D.   On: 09/18/2021 14:56 ? ?MM DIAG BREAST TOMO UNI RIGHT ? ?Result Date: 09/18/2021 ?CLINICAL DATA:  Patient returns after screening study for evaluation of possible RIGHT breast asymmetry. EXAM: DIGITAL DIAGNOSTIC UNILATERAL RIGHT MAMMOGRAM WITH TOMOSYNTHESIS AND CAD; ULTRASOUND RIGHT BREAST  LIMITED TECHNIQUE: Right digital diagnostic mammography and breast tomosynthesis was performed. The images were evaluated with computer-aided detection.; Targeted ultrasound examination of the right breast wa

## 2021-10-02 ENCOUNTER — Encounter: Payer: Self-pay | Admitting: *Deleted

## 2021-10-02 DIAGNOSIS — C50211 Malignant neoplasm of upper-inner quadrant of right female breast: Secondary | ICD-10-CM

## 2021-10-04 ENCOUNTER — Encounter: Payer: Self-pay | Admitting: *Deleted

## 2021-10-04 ENCOUNTER — Other Ambulatory Visit: Payer: Self-pay

## 2021-10-04 ENCOUNTER — Inpatient Hospital Stay: Payer: Managed Care, Other (non HMO) | Attending: Hematology and Oncology | Admitting: Hematology and Oncology

## 2021-10-04 ENCOUNTER — Inpatient Hospital Stay: Payer: Managed Care, Other (non HMO) | Admitting: Genetic Counselor

## 2021-10-04 ENCOUNTER — Encounter: Payer: Self-pay | Admitting: Physical Therapy

## 2021-10-04 ENCOUNTER — Encounter: Payer: Self-pay | Admitting: General Practice

## 2021-10-04 ENCOUNTER — Ambulatory Visit: Payer: Managed Care, Other (non HMO) | Attending: Surgery | Admitting: Physical Therapy

## 2021-10-04 ENCOUNTER — Encounter: Payer: Self-pay | Admitting: Emergency Medicine

## 2021-10-04 ENCOUNTER — Inpatient Hospital Stay: Payer: Managed Care, Other (non HMO)

## 2021-10-04 ENCOUNTER — Other Ambulatory Visit: Payer: Self-pay | Admitting: Surgery

## 2021-10-04 ENCOUNTER — Ambulatory Visit
Admission: RE | Admit: 2021-10-04 | Discharge: 2021-10-04 | Disposition: A | Discharge status: Home / Self Care | Payer: Managed Care, Other (non HMO) | Source: Ambulatory Visit | Attending: Radiation Oncology | Admitting: Radiation Oncology

## 2021-10-04 DIAGNOSIS — Z170 Estrogen receptor positive status [ER+]: Secondary | ICD-10-CM | POA: Diagnosis present

## 2021-10-04 DIAGNOSIS — R293 Abnormal posture: Secondary | ICD-10-CM | POA: Insufficient documentation

## 2021-10-04 DIAGNOSIS — C50211 Malignant neoplasm of upper-inner quadrant of right female breast: Secondary | ICD-10-CM

## 2021-10-04 DIAGNOSIS — Z853 Personal history of malignant neoplasm of breast: Secondary | ICD-10-CM

## 2021-10-04 LAB — CMP (CANCER CENTER ONLY)
ALT: 13 U/L (ref 0–44)
AST: 17 U/L (ref 15–41)
Albumin: 4.3 g/dL (ref 3.5–5.0)
Alkaline Phosphatase: 87 U/L (ref 38–126)
Anion gap: 5 (ref 5–15)
BUN: 14 mg/dL (ref 6–20)
CO2: 32 mmol/L (ref 22–32)
Calcium: 9.5 mg/dL (ref 8.9–10.3)
Chloride: 106 mmol/L (ref 98–111)
Creatinine: 1.04 mg/dL — ABNORMAL HIGH (ref 0.44–1.00)
GFR, Estimated: >60 mL/min (ref >60)
Glucose, Bld: 97 mg/dL (ref 70–99)
Potassium: 3.7 mmol/L (ref 3.5–5.1)
Sodium: 143 mmol/L (ref 135–145)
Total Bilirubin: 0.5 mg/dL (ref 0.3–1.2)
Total Protein: 7.7 g/dL (ref 6.5–8.1)

## 2021-10-04 LAB — CBC WITH DIFFERENTIAL (CANCER CENTER ONLY)
Abs Immature Granulocytes: 0.02 10*3/uL (ref 0.00–0.07)
Basophils Absolute: 0.1 10*3/uL (ref 0.0–0.1)
Basophils Relative: 1 %
Eosinophils Absolute: 0.2 10*3/uL (ref 0.0–0.5)
Eosinophils Relative: 3 %
HCT: 38.7 % (ref 36.0–46.0)
Hemoglobin: 13.1 g/dL (ref 12.0–15.0)
Immature Granulocytes: 0 %
Lymphocytes Relative: 32 %
Lymphs Abs: 2 10*3/uL (ref 0.7–4.0)
MCH: 31.5 pg (ref 26.0–34.0)
MCHC: 33.9 g/dL (ref 30.0–36.0)
MCV: 93 fL (ref 80.0–100.0)
Monocytes Absolute: 0.6 10*3/uL (ref 0.1–1.0)
Monocytes Relative: 10 %
Neutro Abs: 3.3 10*3/uL (ref 1.7–7.7)
Neutrophils Relative %: 54 %
Platelet Count: 296 10*3/uL (ref 150–400)
RBC: 4.16 MIL/uL (ref 3.87–5.11)
RDW: 12.7 % (ref 11.5–15.5)
WBC Count: 6.2 10*3/uL (ref 4.0–10.5)
nRBC: 0 % (ref 0.0–0.2)

## 2021-10-04 LAB — GENETIC SCREENING ORDER

## 2021-10-04 NOTE — Progress Notes (Signed)
[facility] Psychosocial Distress Screening ?Spiritual Care ? ?Met with Terri Montes in [facility] to introduce [place] team/resources, reviewing distress screen per protocol.  The patient scored a 10 on the Psychosocial Distress Thermometer which indicates severe distress. Also assessed for distress and other psychosocial needs.  ? ? 10/04/2021  ?ONCBCN DISTRESS SCREENING   ?Screening Type Initial Screening   ?Distress experienced in past week (1-10) 10 !   ?Practical problem type Insurance   ?Emotional problem type Nervousness/Anxiety;Adjusting to illness   ?Information Concerns Type Lack of info about diagnosis;Lack of info about treatment;Lack of info about complementary therapy choices;Lack of info about maintaining fitness   ?Referral to support programs Yes   ?  ? ?[name] notes that she has good support and found clinic very helpful--meeting team and learning about scope of diagnosis and treatment helped resolve the unknown. She notes that having more information and a plan has helped decrease her initial distress. ? ?Provided pastoral presence, empathic listening, and normalization of feelings. ? ?Follow up needed: Yes.  We plan to follow up by phone in ca two weeks for a pastoral check-in. ? ? ?Chaplain Lorrin Jackson, MDiv, [facility] ?Pager [phone] ?Voicemail [phone] ? ? ? ? ?  ?

## 2021-10-04 NOTE — Research (Signed)
[employer] 2021-05 - Specimen Collection Study to Evaluate Biomarkers in Subjects with Cancer  ? ?10/04/21 ? ?Informed Consent:  ?Patient Terri Montes was identified by Dr. Lindi Adie as a potential candidate for the above listed study.  This Clinical Research Coordinator met with [name], [id number], on 10/04/21 in a manner and location that ensures patient privacy to discuss participation in the above listed research study.  A copy of the informed consent document with embedded HIPAA language was provided to the patient.  Patient reads, speaks, and understands [place].   ? ?Patient was provided with the business card of this Coordinator and encouraged to contact the research team with any questions.  Patient was provided the option of taking informed consent documents home to review and was encouraged to review at their convenience with their support network, including other care providers. Patient is comfortable with making a decision regarding study participation today. ? ?As outlined in the informed consent form, this Coordinator and [name] discussed the purpose of the research study, the investigational nature of the study, study procedures and requirements for study participation, potential risks and benefits of study participation, as well as alternatives to participation. This study is not blinded. The patient understands participation is voluntary and they may withdraw from study participation at any time.  This study does not involve randomization.  This study does not involve an investigational drug or device. This study does not involve a placebo. Patient understands enrollment is pending full eligibility review.  ? ?Confidentiality and how the patient's information will be used as part of study participation were discussed.  Patient was informed there is reimbursement provided for their time and effort spent on trial participation.  The patient is encouraged to discuss research study  participation with their insurance provider to determine what costs they may incur as part of study participation, including research related injury.   ? ?All questions were answered to patient's satisfaction.  The informed consent with embedded HIPAA language was reviewed page by page.  The patient's mental and emotional status is appropriate to provide informed consent, and the patient verbalizes an understanding of study participation.  Patient has agreed to participate in the above listed research study and has voluntarily signed the informed consent revised 26 Jun 2021  with embedded HIPAA language, version revised 26 Jun 2021  on 10/04/21 at 02:43 PM.  The patient was provided with a copy of the signed informed consent form with embedded HIPAA language for their reference.  No study specific procedures were obtained prior to the signing of the informed consent document.  Approximately 15 minutes were spent with the patient reviewing the informed consent documents.  Patient was not requested to complete a Release of Information form. ? ?Plan:  ?Will follow up with the patient to schedule lab appointment to collect research specimen. ? ?Clabe Seal ?Clinical Research Coordinator I  ?10/04/21 3:00 PM  ?

## 2021-10-04 NOTE — Assessment & Plan Note (Signed)
09/22/2021:Screening mammogram detected right breast asymmetry spiculated mass 0.9 cm at 1 o'clock position, axilla negative, ultrasound-guided biopsy: Grade 1 IDC ER 100%, PR 100%, HER2 equivocal by IHC, FISH pending, Ki-67 less than 5% ? ?Pathology and radiology counseling:Discussed with the patient, the details of pathology including the type of breast cancer,the clinical staging, the significance of ER, PR and HER-2/neu receptors and the implications for treatment. After reviewing the pathology in detail, we proceeded to discuss the different treatment options between surgery, radiation, chemotherapy, antiestrogen therapies. ? ?Recommendations: ?1. Breast conserving surgery followed by ?2. Oncotype DX testing if the final tumor size is greater than 1 cm ?3. Adjuvant radiation therapy followed by ?4. Adjuvant antiestrogen therapy ? ?Oncotype counseling: I discussed Oncotype DX test. I explained to the patient that this is a 21 gene panel to evaluate patient tumors DNA to calculate recurrence score. This would help determine whether patient has high risk or low risk breast cancer. She understands that if her tumor was found to be high risk, she would benefit from systemic chemotherapy. If low risk, no need of chemotherapy. ? ?Return to clinic after surgery to discuss final pathology report and then determine if Oncotype DX testing will need to be sent. ? ? ?

## 2021-10-04 NOTE — Therapy (Signed)
?OUTPATIENT PHYSICAL THERAPY BREAST CANCER BASELINE EVALUATION ? ? ?Patient Name: Terri Montes ?MRN: [id number] ?DOB:Mar 23, 1961, 61 y.o., female ?Today's Date: 10/04/2021 ? ? PT End of Session - 10/04/21 1440   ? ? Visit Number 1   ? Number of Visits 2   ? Date for PT Re-Evaluation 11/29/21   ? PT Start Time 1328   ? PT Stop Time 1344   Also saw pt from 1400-1428 for a total of 44 min  ? PT Time Calculation (min) 16 min   ? Activity Tolerance Patient tolerated treatment well   ? Behavior During Therapy [facility] for tasks assessed/performed   ? ?  ?  ? ?  ? ? ?Past Medical History:  ?Diagnosis Date  ? Allergy   ? seasonal  ? Anxiety   ? Depression   ? Hypercholesteremia   ? Hypertension   ? ?Past Surgical History:  ?Procedure Laterality Date  ? ABDOMINAL HYSTERECTOMY  03/28/2010  ? partial  ? BREAST BIOPSY Right 2002  ? benign  ? BREAST SURGERY Right 05/28/2000  ? benign breast biopsy  ? CESAREAN SECTION  04/27/1994  ? REPAIR PERONEAL TENDONS ANKLE Bilateral 2003, 2006  ? ?Patient Active Problem List  ? Diagnosis Date Noted  ? Malignant neoplasm of upper-inner quadrant of right female breast ([place]) 10/02/2021  ? Keratoconus of both eyes 10/09/2013  ? Dependent edema 02/15/2013  ? Situational stress 08/16/2012  ? Anxiety state, unspecified 07/28/2012  ? Carbuncles 07/15/2012  ? Essential hypertension, benign 07/15/2012  ? History of migraine headaches 01/22/2011  ? ? ?PCP: Dr. Tedra Senegal ? ?REFERRING PROVIDER: Dr. Coralie Keens ? ?REFERRING DIAG: Right breast cancer ? ?THERAPY DIAG:  ?Malignant neoplasm of upper-inner quadrant of right breast in female, estrogen receptor positive ([place]) ? ?Abnormal posture ? ?ONSET DATE: 09/25/2021 ? ?SUBJECTIVE                                                                                                                                                                                          ? ?SUBJECTIVE STATEMENT: ?Patient reports she is here today to be seen by her medical team for  her newly diagnosed right breast cancer.  ? ?PERTINENT HISTORY:  ?Patient was diagnosed on 09/25/2021 with right grade I invasive ductal carcinoma breast cancer. It measures 9 mm and is located in the upper inner quadrant. It is ER/PR positive and HER2 negative with a Ki67 of < 5%. She had a left total knee replacement in 08/2019. ? ?PATIENT GOALS   reduce lymphedema risk and learn post op HEP.  ? ?PAIN:  ?Are you having pain? No ? ? ?PRECAUTIONS: Active  CA  ? ?HAND DOMINANCE: right ? ?WEIGHT BEARING RESTRICTIONS No ? ?FALLS:  ?Has patient fallen in last 6 months? No ? ?LIVING ENVIRONMENT: ?Patient lives with: her husband ?Lives in: House/apartment ?Has following equipment at home: None ? ?OCCUPATION: Works full time for [employer] from home; on computer all day ? ?LEISURE: She walks about 15 min/day ? ?PRIOR LEVEL OF FUNCTION: Independent ? ? ?OBJECTIVE ? ?COGNITION: ? Overall cognitive status: Within functional limits for tasks assessed   ? ?POSTURE:  ?Forward head and rounded shoulders posture ? ?UPPER EXTREMITY AROM/PROM: ? ?A/PROM RIGHT  10/04/2021 ?  ?Shoulder extension 51  ?Shoulder flexion 143  ?Shoulder abduction 154  ?Shoulder internal rotation 72  ?Shoulder external rotation 84  ?  (Blank rows = not tested) ? ?A/PROM LEFT  10/04/2021  ?Shoulder extension 52  ?Shoulder flexion 140  ?Shoulder abduction 159  ?Shoulder internal rotation 68  ?Shoulder external rotation 71  ?  (Blank rows = not tested) ? ? ?CERVICAL AROM: ?All within normal limits ? ? ?UPPER EXTREMITY STRENGTH: WNL ? ? ?LYMPHEDEMA ASSESSMENTS:  ? ?LAND[place] RIGHT  10/04/2021  ?10 cm proximal to olecranon process 40.5  ?Olecranon process 31.4  ?10 cm proximal to ulnar styloid process 27.5  ?Just proximal to ulnar styloid process 18.5  ?Across hand at thumb web space 21.4  ?At base of 2nd digit 7.2  ?(Blank rows = not tested) ? ?[place] LEFT  10/04/2021  ?10 cm proximal to olecranon process 40  ?Olecranon process 32.4  ?10 cm proximal to ulnar styloid  process 26.8  ?Just proximal to ulnar styloid process 18  ?Across hand at thumb web space 21.8  ?At base of 2nd digit 7.1  ?(Blank rows = not tested) ? ? ?L-DEX LYMPHEDEMA SCREENING: ? ?The patient was assessed using the L-Dex machine today to produce a lymphedema index baseline score. The patient will be reassessed on a regular basis (typically every 3 months) to obtain new L-Dex scores. If the score is > 6.5 points away from his/her baseline score indicating onset of subclinical lymphedema, it will be recommended to wear a compression garment for 4 weeks, 12 hours per day and then be reassessed. If the score continues to be > 6.5 points from baseline at reassessment, we will initiate lymphedema treatment. Assessing in this manner has a 95% rate of preventing clinically significant lymphedema. ? ? L-DEX FLOWSHEETS - 10/04/21 1500   ? ?  ? L-DEX LYMPHEDEMA SCREENING  ? Measurement Type Unilateral   ? L-DEX MEASUREMENT EXTREMITY Upper Extremity   ? POSITION  Standing   ? DOMINANT SIDE Right   ? At Risk Side Right   ? BASELINE SCORE (UNILATERAL) -5.2   ? ?  ?  ? ?  ? ? ? ?QUICK DASH SURVEY: ? Katina Dung - 10/04/21 0001   ? ? Open a tight or new jar No difficulty   ? Do heavy household chores (wash walls, wash floors) No difficulty   ? Carry a shopping bag or briefcase No difficulty   ? Wash your back No difficulty   ? Use a knife to cut food No difficulty   ? Recreational activities in which you take some force or impact through your arm, shoulder, or hand (golf, hammering, tennis) No difficulty   ? During the past week, to what extent has your arm, shoulder or hand problem interfered with your normal social activities with family, friends, neighbors, or groups? Slightly   ? During the past week, to what extent  has your arm, shoulder or hand problem limited your work or other regular daily activities Not at all   ? Arm, shoulder, or hand pain. Mild   ? Tingling (pins and needles) in your arm, shoulder, or hand None    ? Difficulty Sleeping Mild difficulty   ? DASH Score 6.82 %   ? ?  ?  ? ?  ? ? ? ?PATIENT EDUCATION:  ?Education details: Lymphedema risk reduction and post op shoulder/posture HEP ?Person educated: Patient ?Education method: Explanation, Demonstration, Handout ?Education comprehension: Patient verbalized understanding and returned demonstration ? ? ?HOME EXERCISE PROGRAM: ?Patient was instructed today in a home exercise program today for post op shoulder range of motion. These included active assist shoulder flexion in sitting, scapular retraction, wall walking with shoulder abduction, and hands behind head external rotation.  She was encouraged to do these twice a day, holding 3 seconds and repeating 5 times when permitted by her physician. ? ? ?ASSESSMENT: ? ?CLINICAL IMPRESSION: ?Patient was diagnosed on 09/25/2021 with right grade I invasive ductal carcinoma breast cancer. It measures 9 mm and is located in the upper inner quadrant. It is ER/PR positive and HER2 negative with a Ki67 of < 5%. She had a left total knee replacement in 08/2019.Her multidisciplinary medical team met prior to her assessments to determine a recommended treatment plan. She is planning to have a right lumpectomy and sentinel node biopsy followed by possible Oncotype testing, radiation, and anti-estrogen therapy. She will benefit from a post op PT reassessment to determine needs and from L-Dex screens every 3 months for 2 years to detect subclinical lymphedema. ? ?Pt will benefit from skilled therapeutic intervention to improve on the following deficits: Decreased knowledge of precautions, impaired UE functional use, pain, decreased ROM, postural dysfunction.  ? ?PT treatment/interventions: ADL/self-care home management, pt/family education, therapeutic exercise ? ?REHAB POTENTIAL: Excellent ? ?CLINICAL DECISION MAKING: Stable/uncomplicated ? ?EVALUATION COMPLEXITY: Low ? ? ?GOALS: ?Goals reviewed with patient? YES ? ?LONG TERM GOALS:  (STG=LTG) ? ? Name Target Date Goal status  ?1 Pt will be able to verbalize understanding of pertinent lymphedema risk reduction practices relevant to her dx specifically related to skin care.  ?Baseline:  No kn

## 2021-10-04 NOTE — Progress Notes (Signed)
[facility] ?CONSULT NOTE ? ?Patient Care Team: ?Elby Showers, MD as PCP - General (Internal Medicine) ?Coralie Keens, MD as Consulting Physician (General Surgery) ?Nicholas Lose, MD as Consulting Physician (Hematology and Oncology) ?Kyung Rudd, MD as Consulting Physician (Radiation Oncology) ?Mauro Kaufmann, RN as Oncology Nurse Navigator ?Rockwell Germany, RN as Oncology Nurse Navigator ? ?CHIEF COMPLAINTS/PURPOSE OF CONSULTATION:  ?Newly diagnosed breast cancer ? ?HISTORY OF PRESENTING ILLNESS:  ?Terri Montes 61 y.o. female is here because of recent diagnosis of right breast mass. ?Screening mammogram  detected Right breast asymmetry. ?Diagnostic mammogram showed Indeterminate mass in the 1 o'clock location of the RIGHT breast.  By ultrasound it measured 0.9 cm.  Axilla negative. 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 ?  ?I reviewed her records extensively and collaborated the history with the patient. ? ?SUMMARY OF ONCOLOGIC HISTORY: ?Oncology History  ?Malignant neoplasm of upper-inner quadrant of right female breast ([place])  ?09/22/2021 Initial Diagnosis  ? Screening mammogram detected right breast asymmetry spiculated mass 0.9 cm at 1 o'clock position, axilla negative, ultrasound-guided biopsy: Grade 1 IDC ER 100%, PR 100%, HER2 equivocal by IHC, FISH pending, Ki-67 less than 5% ?  ? ? ? ?MEDICAL HISTORY:  ?Past Medical History:  ?Diagnosis Date  ? Allergy   ? seasonal  ? Anxiety   ? Depression   ? Hypercholesteremia   ? Hypertension   ? ? ?SURGICAL HISTORY: ?Past Surgical History:  ?Procedure Laterality Date  ? ABDOMINAL HYSTERECTOMY  03/28/2010  ? partial  ? BREAST BIOPSY Right 2002  ? benign  ? BREAST SURGERY Right 05/28/2000  ? benign breast biopsy  ? CESAREAN SECTION  04/27/1994  ?  REPAIR PERONEAL TENDONS ANKLE Bilateral 2003, 2006  ? ? ?SOCIAL HISTORY: ?Social History  ? ?Socioeconomic History  ? Marital status: Married  ?  Spouse name: Not on file  ? Number of children: Not on file  ? Years of education: Not on file  ? Highest education level: Not on file  ?Occupational History  ? Not on file  ?Tobacco Use  ? Smoking status: Never  ? Smokeless tobacco: Never  ?Substance and Sexual Activity  ? Alcohol use: No  ?  Alcohol/week: 0.0 standard drinks  ? Drug use: No  ? Sexual activity: Not on file  ?Other Topics Concern  ? Not on file  ?Social History Narrative  ? Not on file  ? ?Social Determinants of Health  ? ?Financial Resource Strain: Not on file  ?Food Insecurity: Not on file  ?Transportation Needs: Not on file  ?Physical Activity: Not on file  ?Stress: Not on file  ?Social Connections: Not on file  ?Intimate Partner Violence: Not on file  ? ? ?FAMILY HISTORY: ?Family History  ?Problem Relation Age of Onset  ? Hypertension Mother   ? Lung cancer Father 54  ? Hypertension Brother   ? Colon cancer Neg Hx   ? Breast cancer Neg Hx   ? ? ?ALLERGIES:  is allergic to sulfa antibiotics. ? ?MEDICATIONS:  ?Current Outpatient Medications  ?Medication Sig Dispense Refill  ? acetaminophen (TYLENOL) 325 MG tablet Take 650 mg by mouth every 6 (six) hours as needed for pain.    ? ALPRAZolam (XANAX) 0.5 MG tablet Take 1 tablet (0.5 mg total) by mouth 2 (  two) times daily as needed for anxiety. 60 tablet 0  ? escitalopram (LEXAPRO) 20 MG tablet TAKE 1 TABLET BY MOUTH  DAILY 90 tablet 3  ? furosemide (LASIX) 20 MG tablet TAKE ONE TABLET EVERY DAY (Patient taking differently: Take 20 mg by mouth as needed.) 90 tablet 1  ? olmesartan (BENICAR) 20 MG tablet Take 1 tablet (20 mg total) by mouth daily. 90 tablet 1  ? rosuvastatin (CRESTOR) 10 MG tablet Take 1 tablet (10 mg total) by mouth daily. 90 tablet 3  ? ketoconazole (NIZORAL) 2 % cream Apply 1 application topically daily. 60 g 2  ? ?No current  facility-administered medications for this visit.  ? ? ?REVIEW OF SYSTEMS:   ?Constitutional: Denies fevers, chills or abnormal night sweats ?  ?All other systems were reviewed with the patient and are negative. ? ?PHYSICAL EXAMINATION: ?ECOG PERFORMANCE STATUS: 1 - Symptomatic but completely ambulatory ? ?Vitals:  ? 10/04/21 1241  ?BP: (!) 158/77  ?Pulse: 78  ?Resp: 18  ?Temp: 97.8 ?F (36.6 ?C)  ?SpO2: 93%  ? ?Filed Weights  ? 10/04/21 1241  ?Weight: 239 lb 3.2 oz (108.5 kg)  ?  ? ?LABORATORY DATA:  ?I have reviewed the data as listed ?Lab Results  ?Component Value Date  ? WBC 6.2 10/04/2021  ? HGB 13.1 10/04/2021  ? HCT 38.7 10/04/2021  ? MCV 93.0 10/04/2021  ? PLT 296 10/04/2021  ? ?Lab Results  ?Component Value Date  ? NA 143 10/04/2021  ? K 3.7 10/04/2021  ? CL 106 10/04/2021  ? CO2 32 10/04/2021  ? ? ?RADIOGRAPHIC STUDIES: ?I have personally reviewed the radiological reports and agreed with the findings in the report. ? ?ASSESSMENT AND PLAN:  ?Malignant neoplasm of upper-inner quadrant of right female breast ([place]) ?09/22/2021:Screening mammogram detected right breast asymmetry spiculated mass 0.9 cm at 1 o'clock position, axilla negative, ultrasound-guided biopsy: Grade 1 IDC ER 100%, PR 100%, HER2 equivocal by IHC, FISH pending, Ki-67 less than 5% ? ?Pathology and radiology counseling:Discussed with the patient, the details of pathology including the type of breast cancer,the clinical staging, the significance of ER, PR and HER-2/neu receptors and the implications for treatment. After reviewing the pathology in detail, we proceeded to discuss the different treatment options between surgery, radiation, chemotherapy, antiestrogen therapies. ? ?Recommendations: ?1. Breast conserving surgery followed by ?2. Oncotype DX testing if the final tumor size is greater than 1 cm and HER2 FISH test was negative. ?3. Adjuvant radiation therapy followed by ?4. Adjuvant antiestrogen therapy ? ?Oncotype counseling: I discussed  Oncotype DX test. I explained to the patient that this is a 21 gene panel to evaluate patient tumors DNA to calculate recurrence score. This would help determine whether patient has high risk or low risk breast cancer. She understands that if her tumor was found to be high risk, she would benefit from systemic chemotherapy. If low risk, no need of chemotherapy. ? ?Return to clinic after surgery to discuss final pathology report and then determine if Oncotype DX testing will need to be sent. ? ? ? ?All questions were answered. The patient knows to call the clinic with any problems, questions or concerns. ?  ? Harriette Ohara, MD ?10/04/21 ? I Gardiner Coins am scribing for Dr. Lindi Adie ? ?I have reviewed the above documentation for accuracy and completeness, and I agree with the above. ?  ?

## 2021-10-05 ENCOUNTER — Encounter: Payer: Self-pay | Admitting: Genetic Counselor

## 2021-10-05 ENCOUNTER — Other Ambulatory Visit: Payer: Self-pay | Admitting: Surgery

## 2021-10-05 DIAGNOSIS — Z853 Personal history of malignant neoplasm of breast: Secondary | ICD-10-CM

## 2021-10-05 NOTE — Progress Notes (Signed)
REFERRING PROVIDER: ?Nicholas Lose, MD ?[address] ?[place],  [place] [id number] ? ?PRIMARY PROVIDER:  ?Elby Showers, MD ? ?PRIMARY REASON FOR VISIT:  ?1. Malignant neoplasm of upper-inner quadrant of right breast in female, estrogen receptor positive ([place])   ? ? ?HISTORY OF PRESENT ILLNESS:   ?Ms. Terri Montes, a 61 y.o. female, was seen for a [facility] cancer genetics consultation at the request of Dr. Lindi Adie due to a personal history of breast cancer.  Ms. Lynam presents to clinic today to discuss the possibility of a hereditary predisposition to cancer, to discuss genetic testing, and to further clarify her future cancer risks, as well as potential cancer risks for family members.  ? ?In April 2023, at the age of 71, Ms. Blizzard was diagnosed with invasive ductal carcinoma of the right breast (ER+/PR+/HER2eq). The preliminary treatment plan includes breast conserving surgery, Oncotype DX to determine potential benefit of chemotherapy, adjuvant radiation, and anti-estrogens.  ? ? ?CANCER HISTORY:  ?Oncology History  ?Malignant neoplasm of upper-inner quadrant of right female breast ([place])  ?09/22/2021 Initial Diagnosis  ? Screening mammogram detected right breast asymmetry spiculated mass 0.9 cm at 1 o'clock position, axilla negative, ultrasound-guided biopsy: Grade 1 IDC ER 100%, PR 100%, HER2 equivocal by IHC, FISH pending, Ki-67 less than 5% ?  ? ? ?RISK FACTORS:  ?Colonoscopy: yes;  most recent in 2016 . ?Hysterectomy in 2011.  ?Up to date with pelvic exams: most recent PAP in 2011. ?Menarche was at age 86.  ?First live birth at age 65. ?OCP use for approximately 7 years.  ?HRT use: 0 years. ? ? ?Past Medical History:  ?Diagnosis Date  ? Allergy   ? seasonal  ? Anxiety   ? Depression   ? Hypercholesteremia   ? Hypertension   ? ? ?Past Surgical History:  ?Procedure Laterality Date  ? ABDOMINAL HYSTERECTOMY  03/28/2010  ? partial  ? BREAST BIOPSY Right 2002  ? benign  ? BREAST SURGERY Right 05/28/2000  ?  benign breast biopsy  ? CESAREAN SECTION  04/27/1994  ? REPAIR PERONEAL TENDONS ANKLE Bilateral 2003, 2006  ? ? ?FAMILY HISTORY:  ?We obtained a 4-generation family history.  Significant diagnoses are listed below: ?Family History  ?Problem Relation Age of Onset  ? Lung cancer Father 54  ? Skin cancer Paternal Grandfather   ? ? ?Ms. Besecker is unaware of previous family history of genetic testing for hereditary cancer risks. There is no reported Ashkenazi Jewish ancestry. There is no known consanguinity. ? ?GENETIC COUNSELING ASSESSMENT: Ms. Feutz is a 61 y.o. female with a personal and family history of cancer which is not highly suggestive of a hereditary cancer syndrome. We, therefore, discussed and recommended the following at today's visit.  ? ?DISCUSSION: We discussed that, in general, most cancer is not inherited in families, but instead is sporadic or familial. Sporadic cancers occur by chance and typically happen at older ages (>50 years) as this type of cancer is caused by genetic changes acquired during an individual?s lifetime. Some families have more cancers than would be expected by chance; however, the ages or types of cancer are not consistent with a known genetic mutation or known genetic mutations have been ruled out. This type of familial cancer is thought to be due to a combination of multiple genetic, environmental, hormonal, and lifestyle factors. While this combination of factors likely increases the risk of cancer, the exact source of this risk is not currently identifiable or testable.   ? ?We  discussed that approximately 5-10% of breast cancer is hereditary, meaning that it is due to a mutation in a single gene that is passed down from generation to generation in a family. We discussed that testing can be beneficial for several reasons, including knowing about other cancer risks, identifying potential screening and risk-reduction options that may be appropriate, and to understand if other  family members could be at risk for cancer and allow them to undergo genetic testing. ? ?We discussed with Ms. Archbold that the personal and family history does not meet insurance or NCCN criteria for genetic testing and, therefore, is not highly consistent with a familial hereditary cancer syndrome.  We feel she is at low risk to harbor a gene mutation associated with such a condition. Thus, we did not recommend any genetic testing at this time.  We discussed that it is reasonable for someone diagnosed with breast cancer at age 80 or younger to consider genetic testing.  We discussed the availability of genetic testing through the self-pay price of $100.  ? ? ?PLAN:  Ms. Mule did not wish to pursue genetic testing at today's visit. We understand this decision and remain available to coordinate genetic testing at any time in the future. We, therefore, recommend Ms. Bogle continue to follow the cancer screening guidelines given by her primary healthcare provider. ? ?Lastly, we encouraged Ms. Carollo to remain in contact with cancer genetics annually so that we can continuously update the family history and inform her of any changes in cancer genetics and testing that may be of benefit for this family.  ? ?Ms. Chamberlain's questions were answered to her satisfaction today. Our contact information was provided should additional questions or concerns arise. Thank you for the referral and allowing [place] to share in the care of your patient.  ? ?Sheelah Ritacco M. Joette Catching, [place], [place] ?Genetic Counselor ?[email or web address] ?(P) [phone] ? ?The patient was seen for a total of 7 minutes in face-to-face genetic counseling.  Drs. Lindi Adie and/or Burr Medico were available to discuss this case as needed.  ? ? ?_______________________________________________________________________ ?For Office Staff:  ?Number of people involved in session: 2 ?Was an Intern/ student involved with case: no ? ?

## 2021-10-06 ENCOUNTER — Telehealth: Payer: Self-pay | Admitting: Emergency Medicine

## 2021-10-06 ENCOUNTER — Other Ambulatory Visit: Payer: Self-pay | Admitting: *Deleted

## 2021-10-06 DIAGNOSIS — Z006 Encounter for examination for normal comparison and control in clinical research program: Secondary | ICD-10-CM

## 2021-10-06 NOTE — Telephone Encounter (Signed)
[employer] 2021-05 - Specimen Collection Study to Evaluate Biomarkers in Subjects with Cancer  ? ?10/06/21 ? ?Called to schedule lab appointment to collect blood sample for this study.  Scheduled lab appointment for Tues 10/10/21 at 2:30pm.   ? ?Collected the following history from the patient over the phone today: ? ?Medical History:  ?High Blood Pressure  Yes ?Coronary Artery Disease No ?Lupus    No ?Rheumatoid Arthritis  No ?Diabetes   No      ?If yes, which type?      ?Lynch Syndrome  No ? ?Is the patient currently taking a magnesium supplement?   No ? ?Does the patient have a personal history of cancer (greater than 5 years ago)?  No ? ?Does the patient have a family history of cancer in 1st or 2nd degree relatives? Yes ?If yes, Relationship(s) and Cancer type(s)? Patient has history of melanoma in her grandfather. ? ?Does the patient have history of alcohol consumption? No   ? ?Does the patient have history of cigarette, cigar, pipe, or chewing tobacco use?  No  ? ?The patient was thanked for her time and participation in this study. ? ?Clabe Seal ?Clinical Research Coordinator I  ?10/06/21  11:13 AM  ?

## 2021-10-10 ENCOUNTER — Encounter: Payer: Self-pay | Admitting: Emergency Medicine

## 2021-10-10 ENCOUNTER — Inpatient Hospital Stay: Payer: Managed Care, Other (non HMO)

## 2021-10-10 DIAGNOSIS — Z170 Estrogen receptor positive status [ER+]: Secondary | ICD-10-CM

## 2021-10-10 DIAGNOSIS — Z006 Encounter for examination for normal comparison and control in clinical research program: Secondary | ICD-10-CM

## 2021-10-10 LAB — RESEARCH LABS

## 2021-10-10 NOTE — Research (Signed)
[employer] 2021-05 - Specimen Collection Study to Evaluate Biomarkers in Subjects with Cancer   ?This Nurse has reviewed this patient's inclusion and exclusion criteria as a second review and confirms Terri Montes is eligible for study participation.  Patient may continue with enrollment.  ?Foye Spurling, BSN, RN, CCRP ?Clinical Research Nurse II ?10/10/2021 2:45 PM ? ?

## 2021-10-10 NOTE — Research (Signed)
[employer] 2021-05 - Specimen Collection Study to Evaluate Biomarkers in Subjects with Cancer  ? ?10/10/21 ? ?Eligibility: Eligibility criteria reviewed with patient. This coordinator has reviewed this patient's inclusion and exclusion criteria and confirmed patient is eligible for study participation. Eligibility confirmed by treating investigator, who also agrees that patient should proceed with enrollment. ?Patient will continue with enrollment. ? ?Blood Collection: Research blood obtained by fresh venipuncture per patient's preference. Patient tolerated well without any adverse events. ? ?Gift Card: $50 gift card given to patient for her participation in this study.  ? ?Clabe Seal ?Clinical Research Coordinator I  ?10/10/21 2:41 PM  ? ?

## 2021-10-12 ENCOUNTER — Encounter: Payer: Self-pay | Admitting: *Deleted

## 2021-10-12 ENCOUNTER — Telehealth: Payer: Self-pay | Admitting: *Deleted

## 2021-10-12 NOTE — Telephone Encounter (Signed)
Spoke with patient to follow up from [facility] 5/10 and assess navigation needs. Answered patient's questions. Encourage her to call should she have any further questions. Patient verbalized understanding.

## 2021-10-18 ENCOUNTER — Encounter: Payer: Self-pay | Admitting: General Practice

## 2021-10-18 NOTE — Progress Notes (Signed)
[facility] Spiritual Care Note  Made [facility] follow-up call for spiritual and emotional check-in.  Terri Montes has been working hard to manage her tasks of responding to diagnosis and preparing for treatment, including setting up MyChart and buying the prescribed compression bra. Affirmed and encouraged that facing and embracing these tasks is a powerful form of self-care.  We talked about how managing a new cancer diagnosis is, in my words, "like getting a new part-time job you didn't apply for," and Terri Montes noted that she has several other "part-time jobs" that she is trying to wrap up, including the stressor of closing out her parents' estate.  Provided empathic listening, emotional support, affirmation of strengths, and normalization of feelings.  We plan to follow up by phone a week or so after her surgery on June 8, and she knows to reach out as needed/desired in the interim.   [place], [place], [facility] Pager [phone] Voicemail [phone]

## 2021-10-20 ENCOUNTER — Telehealth: Payer: Self-pay | Admitting: *Deleted

## 2021-10-20 NOTE — Telephone Encounter (Signed)
Received VM from patient regarding some questions about recovery time post op. Returned patient's call but had to leave VM for a return phone call.

## 2021-10-24 ENCOUNTER — Encounter (HOSPITAL_BASED_OUTPATIENT_CLINIC_OR_DEPARTMENT_OTHER): Payer: Self-pay | Admitting: Surgery

## 2021-10-24 ENCOUNTER — Other Ambulatory Visit: Payer: Self-pay

## 2021-11-01 ENCOUNTER — Ambulatory Visit
Admission: RE | Admit: 2021-11-01 | Discharge: 2021-11-01 | Disposition: A | Discharge status: Home / Self Care | Payer: Managed Care, Other (non HMO) | Source: Ambulatory Visit | Attending: Surgery | Admitting: Surgery

## 2021-11-01 ENCOUNTER — Encounter (HOSPITAL_BASED_OUTPATIENT_CLINIC_OR_DEPARTMENT_OTHER)
Admission: RE | Admit: 2021-11-01 | Discharge: 2021-11-01 | Disposition: A | Discharge status: Home / Self Care | Payer: Managed Care, Other (non HMO) | Source: Ambulatory Visit | Attending: Orthopaedic Surgery | Admitting: Orthopaedic Surgery

## 2021-11-01 DIAGNOSIS — Z170 Estrogen receptor positive status [ER+]: Secondary | ICD-10-CM | POA: Diagnosis not present

## 2021-11-01 DIAGNOSIS — Z853 Personal history of malignant neoplasm of breast: Secondary | ICD-10-CM

## 2021-11-01 DIAGNOSIS — I10 Essential (primary) hypertension: Secondary | ICD-10-CM | POA: Diagnosis not present

## 2021-11-01 DIAGNOSIS — C50911 Malignant neoplasm of unspecified site of right female breast: Secondary | ICD-10-CM | POA: Diagnosis present

## 2021-11-01 LAB — BASIC METABOLIC PANEL
Anion gap: 6 (ref 5–15)
BUN: 9 mg/dL (ref 6–20)
CO2: 28 mmol/L (ref 22–32)
Calcium: 9.5 mg/dL (ref 8.9–10.3)
Chloride: 108 mmol/L (ref 98–111)
Creatinine, Ser: 1.05 mg/dL — ABNORMAL HIGH (ref 0.44–1.00)
GFR, Estimated: >60 mL/min (ref >60)
Glucose, Bld: 94 mg/dL (ref 70–99)
Potassium: 4.1 mmol/L (ref 3.5–5.1)
Sodium: 142 mmol/L (ref 135–145)

## 2021-11-01 NOTE — H&P (Signed)
PROVIDER: Beverlee Nims, MD  MRN: [id number] DOB: 1960/07/07  Subjective   Chief Complaint: Breast Cancer   History of Present Illness: Terri Montes is a 61 y.o. female who is seen  as an office consultation for evaluation of Breast Cancer .   This is a 61 year old female who was found to have a small asymmetry in the right breast on recent screening mammography. She underwent an ultrasound showing a 9 mm mass at 1 o'clock position. Axilla was negative. She underwent a biopsy of the mass showing a grade 1 invasive ductal carcinoma. It was 100% ER/PR positive, HER2 negative, and had a Ki-67 of less than 5%. She has had a previous benign left breast lumpectomy many years ago. She is otherwise healthy and without complaints. She has no cardiopulmonary issues. She has had no problems with general anesthesia with her previous surgeries.  Review of Systems: A complete review of systems was obtained from the patient. I have reviewed this information and discussed as appropriate with the patient. See HPI as well for other ROS.  ROS   Medical History: Past Medical History:  Diagnosis Date   Anxiety   Hyperlipidemia   Hypertension   Patient Active Problem List  Diagnosis   Essential hypertension, benign   Malignant neoplasm of upper-inner quadrant of right female breast (CMS-HCC)   Keratoconus of both eyes   Hyperlipidemia   History of total knee arthroplasty   Nervousness   Anxiousness   Past Surgical History:  Procedure Laterality Date   CESAREAN SECTION N/A 1995   HYSTERECTOMY    Allergies  Allergen Reactions   Sulfa (Sulfonamide Antibiotics) Hives   Current Outpatient Medications on File Prior to Visit  Medication Sig Dispense Refill   ALPRAZolam (XANAX) 0.5 MG tablet Take 0.5 mg by mouth 2 (two) times daily as needed   FUROsemide (LASIX) 20 MG tablet Take 1 tablet by mouth once daily Pt is taking differently; pt is taking 76m PO PRN   ibuprofen (MOTRIN) 800  MG tablet Take 800 mg by mouth 3 (three) times daily as needed   ketoconazole (NIZORAL) 2 % cream Apply 1 Application topically once daily   olmesartan (BENICAR) 20 MG tablet Take 20 mg by mouth once daily   rosuvastatin (CRESTOR) 10 MG tablet Take 1 tablet by mouth once daily   acetaminophen (TYLENOL) 325 MG tablet Take 650 mg by mouth every 6 (six) hours as needed   escitalopram oxalate (LEXAPRO) 20 MG tablet Take 1 tablet by mouth once daily   ketotifen (ZADITOR) 0.025 % (0.035 %) ophthalmic solution Place 1 drop into both eyes 2 (two) times daily   No current facility-administered medications on file prior to visit.   Family History  Problem Relation Age of Onset   Coronary Artery Disease (Blocked arteries around heart) Mother   High blood pressure (Hypertension) Mother   Hyperlipidemia (Elevated cholesterol) Mother   Coronary Artery Disease (Blocked arteries around heart) Brother   High blood pressure (Hypertension) Brother    Social History   Tobacco Use  Smoking Status Never  Smokeless Tobacco Never    Social History   Socioeconomic History   Marital status: Married  Tobacco Use   Smoking status: Never   Smokeless tobacco: Never  Vaping Use   Vaping Use: Never used  Substance and Sexual Activity   Alcohol use: Never   Drug use: Never   Sexual activity: Defer   Objective:  There were no vitals filed for this visit.  There  is no height or weight on file to calculate BMI.  Physical Exam   She appears well on exam  There are no palpable breast masses. There is minimal ecchymosis from her biopsy. The right breast nipple areolar complex appears normal. There is no axillary adenopathy  Labs, Imaging and Diagnostic Testing: I have reviewed her mammograms, ultrasound, pathology results  Assessment and Plan:   Diagnoses and all orders for this visit:  Invasive ductal carcinoma of breast, female, right (CMS-HCC)    We have discussed her in a multidisciplinary  breast cancer conference. She has a small invasive right breast cancer. I had a discussion with the patient and her husband regarding the surgical aspects of breast cancer treatment. We discussed breast conservation versus mastectomy. She wishes to proceed with breast conservation. I next proceeded with discussing a radioactive seed guided right breast lumpectomy and sentinel node biopsy. We discussed the reasons why we did not examine the lymph nodes. I explained the surgical procedure in detail. We discussed the risk which includes but is not limited to bleeding, infection, the need for further procedures if the margins or lymph nodes are positive, arm swelling, cardiopulmonary issues, postoperative recovery, etc. Patient's main understanding and she wishes to proceed with surgery soon as possible. Surgery will be scheduled.

## 2021-11-02 ENCOUNTER — Other Ambulatory Visit: Payer: Self-pay

## 2021-11-02 ENCOUNTER — Ambulatory Visit (HOSPITAL_BASED_OUTPATIENT_CLINIC_OR_DEPARTMENT_OTHER): Payer: Managed Care, Other (non HMO) | Admitting: Certified Registered"

## 2021-11-02 ENCOUNTER — Encounter (HOSPITAL_BASED_OUTPATIENT_CLINIC_OR_DEPARTMENT_OTHER): Payer: Self-pay | Admitting: Surgery

## 2021-11-02 ENCOUNTER — Ambulatory Visit
Admission: RE | Admit: 2021-11-02 | Discharge: 2021-11-02 | Disposition: A | Discharge status: Home / Self Care | Payer: Managed Care, Other (non HMO) | Source: Ambulatory Visit | Attending: Surgery | Admitting: Surgery

## 2021-11-02 ENCOUNTER — Ambulatory Visit (HOSPITAL_BASED_OUTPATIENT_CLINIC_OR_DEPARTMENT_OTHER)
Admission: RE | Admit: 2021-11-02 | Discharge: 2021-11-02 | Disposition: A | Discharge status: Home / Self Care | Payer: Managed Care, Other (non HMO) | Source: Ambulatory Visit | Attending: Surgery | Admitting: Surgery

## 2021-11-02 ENCOUNTER — Encounter (HOSPITAL_BASED_OUTPATIENT_CLINIC_OR_DEPARTMENT_OTHER)
Admission: RE | Disposition: A | Discharge status: Home / Self Care | Payer: Self-pay | Source: Ambulatory Visit | Attending: Surgery

## 2021-11-02 DIAGNOSIS — Z170 Estrogen receptor positive status [ER+]: Secondary | ICD-10-CM | POA: Insufficient documentation

## 2021-11-02 DIAGNOSIS — Z853 Personal history of malignant neoplasm of breast: Secondary | ICD-10-CM

## 2021-11-02 DIAGNOSIS — I10 Essential (primary) hypertension: Secondary | ICD-10-CM

## 2021-11-02 DIAGNOSIS — C50911 Malignant neoplasm of unspecified site of right female breast: Secondary | ICD-10-CM | POA: Diagnosis not present

## 2021-11-02 DIAGNOSIS — Z01818 Encounter for other preprocedural examination: Secondary | ICD-10-CM

## 2021-11-02 HISTORY — PX: BREAST LUMPECTOMY WITH RADIOACTIVE SEED AND SENTINEL LYMPH NODE BIOPSY: SHX6550

## 2021-11-02 HISTORY — DX: Other specified postprocedural states: Z98.890

## 2021-11-02 SURGERY — BREAST LUMPECTOMY WITH RADIOACTIVE SEED AND SENTINEL LYMPH NODE BIOPSY
Anesthesia: General | Site: Breast | Laterality: Right

## 2021-11-02 MED ORDER — PROPOFOL 500 MG/50ML IV EMUL
INTRAVENOUS | Status: DC | PRN
  Administered 2021-11-02: 25 ug/kg/min via INTRAVENOUS

## 2021-11-02 MED ORDER — CHLORHEXIDINE GLUCONATE CLOTH 2 % EX PADS: 6.0000 | MEDICATED_PAD | Freq: Once | CUTANEOUS | Status: DC

## 2021-11-02 MED ORDER — LIDOCAINE 2% (20 MG/ML) 5 ML SYRINGE
INTRAMUSCULAR | Status: DC | PRN
  Administered 2021-11-02: 40 mg via INTRAVENOUS

## 2021-11-02 MED ORDER — LIDOCAINE 2% (20 MG/ML) 5 ML SYRINGE
INTRAMUSCULAR | Status: AC
  Filled 2021-11-02: qty 25

## 2021-11-02 MED ORDER — MAGTRACE LYMPHATIC TRACER
INTRAMUSCULAR | Status: DC | PRN
  Administered 2021-11-02: 2 mL via INTRAMUSCULAR

## 2021-11-02 MED ORDER — PHENYLEPHRINE 80 MCG/ML (10ML) SYRINGE FOR IV PUSH (FOR BLOOD PRESSURE SUPPORT)
PREFILLED_SYRINGE | INTRAVENOUS | Status: AC
  Filled 2021-11-02: qty 10

## 2021-11-02 MED ORDER — LACTATED RINGERS IV SOLN: INTRAVENOUS | Status: DC

## 2021-11-02 MED ORDER — CELECOXIB 200 MG PO CAPS
200.0000 mg | ORAL_CAPSULE | Freq: Once | ORAL | Status: AC
  Administered 2021-11-02: 200 mg via ORAL

## 2021-11-02 MED ORDER — MEPERIDINE HCL 25 MG/ML IJ SOLN: 6.2500 mg | INTRAMUSCULAR | Status: DC | PRN

## 2021-11-02 MED ORDER — DEXAMETHASONE SODIUM PHOSPHATE 10 MG/ML IJ SOLN
INTRAMUSCULAR | Status: DC | PRN
  Administered 2021-11-02: 4 mg
  Administered 2021-11-02: 10 mg

## 2021-11-02 MED ORDER — OXYCODONE HCL 5 MG/5ML PO SOLN: 5.0000 mg | Freq: Once | ORAL | Status: DC | PRN

## 2021-11-02 MED ORDER — FENTANYL CITRATE (PF) 100 MCG/2ML IJ SOLN: 25.0000 ug | INTRAMUSCULAR | Status: DC | PRN

## 2021-11-02 MED ORDER — ACETAMINOPHEN 160 MG/5ML PO SOLN: 325.0000 mg | ORAL | Status: DC | PRN

## 2021-11-02 MED ORDER — MIDAZOLAM HCL 2 MG/2ML IJ SOLN
2.0000 mg | Freq: Once | INTRAMUSCULAR | Status: AC
  Administered 2021-11-02: 2 mg via INTRAVENOUS

## 2021-11-02 MED ORDER — TRAMADOL HCL 50 MG PO TABS: 50.0000 mg | ORAL_TABLET | Freq: Four times a day (QID) | ORAL | 0 refills | Status: DC | PRN

## 2021-11-02 MED ORDER — ONDANSETRON HCL 4 MG/2ML IJ SOLN
INTRAMUSCULAR | Status: DC | PRN
  Administered 2021-11-02: 4 mg via INTRAVENOUS

## 2021-11-02 MED ORDER — CEFAZOLIN SODIUM-DEXTROSE 2-4 GM/100ML-% IV SOLN
2.0000 g | INTRAVENOUS | Status: AC
  Administered 2021-11-02: 2 g via INTRAVENOUS

## 2021-11-02 MED ORDER — PHENYLEPHRINE HCL (PRESSORS) 10 MG/ML IV SOLN
INTRAVENOUS | Status: DC | PRN
  Administered 2021-11-02: 80 ug via INTRAVENOUS
  Administered 2021-11-02: 40 ug via INTRAVENOUS
  Administered 2021-11-02: 80 ug via INTRAVENOUS
  Administered 2021-11-02: 40 ug via INTRAVENOUS

## 2021-11-02 MED ORDER — FENTANYL CITRATE (PF) 100 MCG/2ML IJ SOLN
INTRAMUSCULAR | Status: AC
  Filled 2021-11-02: qty 2

## 2021-11-02 MED ORDER — FENTANYL CITRATE (PF) 100 MCG/2ML IJ SOLN
100.0000 ug | Freq: Once | INTRAMUSCULAR | Status: AC
  Administered 2021-11-02: 100 ug via INTRAVENOUS

## 2021-11-02 MED ORDER — ACETAMINOPHEN 500 MG PO TABS
ORAL_TABLET | ORAL | Status: AC
  Filled 2021-11-02: qty 2

## 2021-11-02 MED ORDER — PROPOFOL 10 MG/ML IV BOLUS
INTRAVENOUS | Status: DC | PRN
  Administered 2021-11-02: 200 mg via INTRAVENOUS

## 2021-11-02 MED ORDER — ROPIVACAINE HCL 7.5 MG/ML IJ SOLN
INTRAMUSCULAR | Status: DC | PRN
  Administered 2021-11-02: 30 mL via PERINEURAL

## 2021-11-02 MED ORDER — ACETAMINOPHEN 500 MG PO TABS
1000.0000 mg | ORAL_TABLET | ORAL | Status: AC
  Administered 2021-11-02: 1000 mg via ORAL

## 2021-11-02 MED ORDER — CEFAZOLIN SODIUM-DEXTROSE 2-4 GM/100ML-% IV SOLN
INTRAVENOUS | Status: AC
  Filled 2021-11-02: qty 100

## 2021-11-02 MED ORDER — ACETAMINOPHEN 325 MG PO TABS: 325.0000 mg | ORAL_TABLET | ORAL | Status: DC | PRN

## 2021-11-02 MED ORDER — CLONIDINE HCL (ANALGESIA) 100 MCG/ML EP SOLN
EPIDURAL | Status: DC | PRN
  Administered 2021-11-02: 100 ug

## 2021-11-02 MED ORDER — CELECOXIB 200 MG PO CAPS
ORAL_CAPSULE | ORAL | Status: AC
  Filled 2021-11-02: qty 1

## 2021-11-02 MED ORDER — ACETAMINOPHEN 500 MG PO TABS: 1000.0000 mg | ORAL_TABLET | Freq: Once | ORAL | Status: DC

## 2021-11-02 MED ORDER — ONDANSETRON HCL 4 MG/2ML IJ SOLN: 4.0000 mg | Freq: Once | INTRAMUSCULAR | Status: DC | PRN

## 2021-11-02 MED ORDER — OXYCODONE HCL 5 MG PO TABS: 5.0000 mg | ORAL_TABLET | Freq: Once | ORAL | Status: DC | PRN

## 2021-11-02 MED ORDER — ENSURE PRE-SURGERY PO LIQD: 296.0000 mL | Freq: Once | ORAL | Status: DC

## 2021-11-02 MED ORDER — FENTANYL CITRATE (PF) 100 MCG/2ML IJ SOLN
INTRAMUSCULAR | Status: DC | PRN
  Administered 2021-11-02: 25 ug via INTRAVENOUS

## 2021-11-02 MED ORDER — DEXAMETHASONE SODIUM PHOSPHATE 10 MG/ML IJ SOLN
INTRAMUSCULAR | Status: AC
  Filled 2021-11-02: qty 3

## 2021-11-02 MED ORDER — BUPIVACAINE-EPINEPHRINE 0.5% -1:200000 IJ SOLN
INTRAMUSCULAR | Status: DC | PRN
  Administered 2021-11-02: 24 mL

## 2021-11-02 MED ORDER — ONDANSETRON HCL 4 MG/2ML IJ SOLN
INTRAMUSCULAR | Status: AC
  Filled 2021-11-02: qty 20

## 2021-11-02 MED ORDER — MIDAZOLAM HCL 2 MG/2ML IJ SOLN
INTRAMUSCULAR | Status: AC
  Filled 2021-11-02: qty 2

## 2021-11-02 SURGICAL SUPPLY — 48 items
ADH SKN CLS APL DERMABOND .7 (GAUZE/BANDAGES/DRESSINGS) ×1
APL PRP STRL LF DISP 70% ISPRP (MISCELLANEOUS) ×1
APPLIER CLIP 9.375 MED OPEN (MISCELLANEOUS) ×2
APR CLP MED 9.3 20 MLT OPN (MISCELLANEOUS) ×1
BLADE SURG 15 STRL LF DISP TIS (BLADE) ×1 IMPLANT
BLADE SURG 15 STRL SS (BLADE) ×2
CANISTER SUCT 1200ML W/VALVE (MISCELLANEOUS) IMPLANT
CHLORAPREP W/TINT 26 (MISCELLANEOUS) ×2 IMPLANT
CLIP APPLIE 9.375 MED OPEN (MISCELLANEOUS) ×1 IMPLANT
COVER BACK TABLE 60X90IN (DRAPES) ×2 IMPLANT
COVER MAYO STAND STRL (DRAPES) ×2 IMPLANT
COVER PROBE W GEL 5X96 (DRAPES) ×2 IMPLANT
DERMABOND ADVANCED (GAUZE/BANDAGES/DRESSINGS) ×1
DERMABOND ADVANCED .7 DNX12 (GAUZE/BANDAGES/DRESSINGS) ×1 IMPLANT
DRAPE LAPAROSCOPIC ABDOMINAL (DRAPES) ×2 IMPLANT
DRAPE UTILITY XL STRL (DRAPES) ×2 IMPLANT
ELECT REM PT RETURN 9FT ADLT (ELECTROSURGICAL) ×2
ELECTRODE REM PT RTRN 9FT ADLT (ELECTROSURGICAL) ×1 IMPLANT
GLOVE BIO SURGEON STRL SZ7 (GLOVE) ×1 IMPLANT
GLOVE BIOGEL PI IND STRL 7.0 (GLOVE) IMPLANT
GLOVE BIOGEL PI INDICATOR 7.0 (GLOVE) ×1
GLOVE SURG SIGNA 7.5 PF LTX (GLOVE) ×2 IMPLANT
GLOVE SURG SS PI 7.0 STRL IVOR (GLOVE) ×1 IMPLANT
GOWN STRL REUS W/ TWL LRG LVL3 (GOWN DISPOSABLE) ×1 IMPLANT
GOWN STRL REUS W/ TWL XL LVL3 (GOWN DISPOSABLE) ×1 IMPLANT
GOWN STRL REUS W/TWL LRG LVL3 (GOWN DISPOSABLE) ×2
GOWN STRL REUS W/TWL XL LVL3 (GOWN DISPOSABLE) ×4
KIT MARKER MARGIN INK (KITS) ×2 IMPLANT
MAT PREVALON FULL STRYKER (MISCELLANEOUS) ×1 IMPLANT
NDL HYPO 25X1 1.5 SAFETY (NEEDLE) ×1 IMPLANT
NDL SAFETY ECLIPSE 18X1.5 (NEEDLE) ×1 IMPLANT
NEEDLE HYPO 18GX1.5 SHARP (NEEDLE) ×2
NEEDLE HYPO 25X1 1.5 SAFETY (NEEDLE) ×2 IMPLANT
NS IRRIG 1000ML POUR BTL (IV SOLUTION) ×2 IMPLANT
PACK BASIN DAY SURGERY FS (CUSTOM PROCEDURE TRAY) ×2 IMPLANT
PENCIL SMOKE EVACUATOR (MISCELLANEOUS) ×2 IMPLANT
SLEEVE SCD COMPRESS KNEE MED (STOCKING) ×2 IMPLANT
SPONGE T-LAP 4X18 ~~LOC~~+RFID (SPONGE) ×2 IMPLANT
SUT MNCRL AB 4-0 PS2 18 (SUTURE) ×2 IMPLANT
SUT SILK 2 0 SH (SUTURE) IMPLANT
SUT VIC AB 3-0 SH 27 (SUTURE) ×2
SUT VIC AB 3-0 SH 27X BRD (SUTURE) ×1 IMPLANT
SYR CONTROL 10ML LL (SYRINGE) ×2 IMPLANT
TOWEL GREEN STERILE FF (TOWEL DISPOSABLE) ×2 IMPLANT
TRACER MAGTRACE VIAL (MISCELLANEOUS) ×1 IMPLANT
TRAY FAXITRON CT DISP (TRAY / TRAY PROCEDURE) ×2 IMPLANT
TUBE CONNECTING 20X1/4 (TUBING) ×1 IMPLANT
YANKAUER SUCT BULB TIP NO VENT (SUCTIONS) ×1 IMPLANT

## 2021-11-02 NOTE — Anesthesia Postprocedure Evaluation (Signed)
Anesthesia Post Note  Patient: Terri Montes  Procedure(s) Performed: RIGHT BREAST LUMPECTOMY WITH RADIOACTIVE SEED AND SENTINEL LYMPH NODE BIOPSY (Right: Breast)     Patient location during evaluation: PACU Anesthesia Type: General Level of consciousness: awake and alert Pain management: pain level controlled Vital Signs Assessment: post-procedure vital signs reviewed and stable Respiratory status: spontaneous breathing, nonlabored ventilation, respiratory function stable and patient connected to nasal cannula oxygen Cardiovascular status: blood pressure returned to baseline and stable Postop Assessment: no apparent nausea or vomiting Anesthetic complications: no   No notable events documented.  Last Vitals:  Vitals:   11/02/21 0800 11/02/21 1006  BP: 114/63 124/79  Pulse: 72 87  Resp: 12 12  Temp:  36.7 C  SpO2: 93% 94%    Last Pain:  Vitals:   11/02/21 1006  TempSrc:   PainSc: Asleep                 Clydell Alberts

## 2021-11-02 NOTE — Anesthesia Procedure Notes (Signed)
Procedure Name: LMA Insertion Date/Time: 11/02/2021 8:46 AM  Performed by: Ugo Thoma, Ernesta Amble, CRNAPre-anesthesia Checklist: Patient identified, Emergency Drugs available, Suction available and Patient being monitored Patient Re-evaluated:Patient Re-evaluated prior to induction Oxygen Delivery Method: Circle system utilized Preoxygenation: Pre-oxygenation with 100% oxygen Induction Type: IV induction Ventilation: Mask ventilation without difficulty LMA: LMA inserted LMA Size: 4.0 Number of attempts: 1 Airway Equipment and Method: Bite block Placement Confirmation: positive ETCO2 Tube secured with: Tape Dental Injury: Teeth and Oropharynx as per pre-operative assessment

## 2021-11-02 NOTE — Progress Notes (Signed)
Assisted Dr. Oddono with right, pectoralis, ultrasound guided block. Side rails up, monitors on throughout procedure. See vital signs in flow sheet. Tolerated Procedure well. 

## 2021-11-02 NOTE — Transfer of Care (Signed)
Immediate Anesthesia Transfer of Care Note  Patient: Terri Montes  Procedure(s) Performed: RIGHT BREAST LUMPECTOMY WITH RADIOACTIVE SEED AND SENTINEL LYMPH NODE BIOPSY (Right: Breast)  Patient Location: PACU  Anesthesia Type:GA combined with regional for post-op pain  Level of Consciousness: drowsy and patient cooperative  Airway & Oxygen Therapy: Patient Spontanous Breathing and Patient connected to face mask oxygen  Post-op Assessment: Report given to RN and Post -op Vital signs reviewed and stable  Post vital signs: Reviewed and stable  Last Vitals:  Vitals Value Taken Time  BP    Temp    Pulse 87 11/02/21 1006  Resp    SpO2 94 % 11/02/21 1006  Vitals shown include unvalidated device data.  Last Pain:  Vitals:   11/02/21 0721  TempSrc: Oral  PainSc: 0-No pain         Complications: No notable events documented.

## 2021-11-02 NOTE — Anesthesia Procedure Notes (Signed)
Anesthesia Regional Block: Pectoralis block   Pre-Anesthetic Checklist: , timeout performed,  Correct Patient, Correct Site, Correct Laterality,  Correct Procedure, Correct Position, site marked,  Risks and benefits discussed,  Surgical consent,  Pre-op evaluation,  At surgeon's request and post-op pain management  Laterality: Right  Prep: chloraprep       Needles:  Injection technique: Single-shot  Needle Type: Echogenic Stimulator Needle     Needle Length: 5cm  Needle Gauge: 22     Additional Needles:   Procedures:, nerve stimulator,,, ultrasound used (permanent image in chart),,    Narrative:  Start time: 11/02/2021 7:58 AM End time: 11/02/2021 8:04 AM Injection made incrementally with aspirations every 5 mL.  Performed by: Personally  Anesthesiologist: Janeece Riggers, MD  Additional Notes: Functioning IV was confirmed and monitors were applied.  A 12m 22ga Arrow echogenic stimulator needle was used. Sterile prep and drape,hand hygiene and sterile gloves were used. Ultrasound guidance: relevant anatomy identified, needle position confirmed, local anesthetic spread visualized around nerve(s)., vascular puncture avoided.  Image printed for medical record. Negative aspiration and negative test dose prior to incremental administration of local anesthetic. The patient tolerated the procedure well.

## 2021-11-02 NOTE — Progress Notes (Signed)
 Patient Care Team: Baxley, Mary J, MD as PCP - General (Internal Medicine) Blackman, Douglas, MD as Consulting Physician (General Surgery) Gudena, Vinay, MD as Consulting Physician (Hematology and Oncology) Moody, John, MD as Consulting Physician (Radiation Oncology) Stuart, Dawn C, RN as Oncology Nurse Navigator Martini, Keisha N, RN as Oncology Nurse Navigator  DIAGNOSIS:  Encounter Diagnosis  Name Primary?   Malignant neoplasm of upper-inner quadrant of right breast in female, estrogen receptor positive (HCC)     SUMMARY OF ONCOLOGIC HISTORY: Oncology History  Malignant neoplasm of upper-inner quadrant of right female breast (HCC)  09/22/2021 Initial Diagnosis   Screening mammogram detected right breast asymmetry spiculated mass 0.9 cm at 1 o'clock position, axilla negative, ultrasound-guided biopsy: Grade 1 IDC ER 100%, PR 100%, HER2 equivocal by IHC, FISH pending, Ki-67 less than 5%     CHIEF COMPLIANT: Right breast cancer follow-up after surgery  INTERVAL HISTORY: Terri Montes is a 60 y.o. female is here because of recent diagnosis of right breast mass. She presents to the clinic today for a consult. States that she is using cold packs on the surgery site for relief. States that the compression bra is too hot. States that it feels hot all day and it is discomfort.   ALLERGIES:  is allergic to sulfa antibiotics.  MEDICATIONS:  Current Outpatient Medications  Medication Sig Dispense Refill   acetaminophen (TYLENOL) 325 MG tablet Take 650 mg by mouth every 6 (six) hours as needed for pain.     ALPRAZolam (XANAX) 0.5 MG tablet Take 1 tablet (0.5 mg total) by mouth 2 (two) times daily as needed for anxiety. 60 tablet 0   escitalopram (LEXAPRO) 20 MG tablet TAKE 1 TABLET BY MOUTH  DAILY 90 tablet 3   escitalopram (LEXAPRO) 20 MG tablet Take 1 tablet by mouth daily.     furosemide (LASIX) 20 MG tablet TAKE ONE TABLET EVERY DAY (Patient taking differently: Take 20 mg by mouth as  needed.) 90 tablet 1   ibuprofen (ADVIL) 800 MG tablet Take by mouth.     ketoconazole (NIZORAL) 2 % cream Apply 1 application topically daily. (Patient taking differently: Apply 1 application  topically daily as needed (as needed).) 60 g 2   ketotifen (ZADITOR) 0.025 % ophthalmic solution Apply to eye.     olmesartan (BENICAR) 20 MG tablet Take 1 tablet (20 mg total) by mouth daily. 90 tablet 1   rosuvastatin (CRESTOR) 10 MG tablet Take 1 tablet (10 mg total) by mouth daily. 90 tablet 3   No current facility-administered medications for this visit.    PHYSICAL EXAMINATION: ECOG PERFORMANCE STATUS: 1 - Symptomatic but completely ambulatory  Vitals:   11/16/21 1417  BP: (!) 142/78  Pulse: 99  Resp: 17  Temp: 98.1 F (36.7 C)  SpO2: 91%   Filed Weights   11/16/21 1417  Weight: 247 lb 4.8 oz (112.2 kg)      LABORATORY DATA:  I have reviewed the data as listed    Latest Ref Rng & Units 11/01/2021   11:39 AM 10/04/2021   12:18 PM 03/03/2020    8:27 AM  CMP  Glucose 70 - 99 mg/dL 94  97  94   BUN 6 - 20 mg/dL 9  14  18   Creatinine 0.44 - 1.00 mg/dL 1.05  1.04  0.94   Sodium 135 - 145 mmol/L 142  143  143   Potassium 3.5 - 5.1 mmol/L 4.1  3.7  4.4   Chloride 98 -   111 mmol/L 108  106  105   CO2 22 - 32 mmol/L 28  32  25   Calcium 8.9 - 10.3 mg/dL 9.5  9.5  9.6   Total Protein 6.5 - 8.1 g/dL  7.7  7.3   Total Bilirubin 0.3 - 1.2 mg/dL  0.5  0.4   Alkaline Phos 38 - 126 U/L  87  113   AST 15 - 41 U/L  17  17   ALT 0 - 44 U/L  13  11     Lab Results  Component Value Date   WBC 6.2 10/04/2021   HGB 13.1 10/04/2021   HCT 38.7 10/04/2021   MCV 93.0 10/04/2021   PLT 296 10/04/2021   NEUTROABS 3.3 10/04/2021    ASSESSMENT & PLAN:  Malignant neoplasm of upper-inner quadrant of right female breast ([place]) 09/22/2021:Screening mammogram detected right breast asymmetry spiculated mass 0.9 cm at 1 o'clock position, axilla negative, ultrasound-guided biopsy: Grade 1 IDC ER 100%, PR  100%, HER2 equivocal by IHC, FISH pending, Ki-67 less than 5%  11/02/21: Right Lumpectomy:  Grade 1 IDC 1.1 cm, with DCIS, Margin neg, 0/2 LN Neg, ER 100%, PR 100%, HER2 equivocal by IHC, FISH pending, Ki-67 less than 5%  Pathology counseling: I discussed the final pathology report of the patient provided  a copy of this report. I discussed the margins as well as lymph node surgeries. We also discussed the final staging along with previously performed ER/PR and HER-2/neu testing.  Treatment Plan: 1. Oncotype DX testing if the final tumor size is greater than 1 cm and HER2 FISH test was negative. 2. Adjuvant radiation therapy followed by 3. Adjuvant antiestrogen therapy  RTC based on oncotype Dx    No orders of the defined types were placed in this encounter.  The patient has a good understanding of the overall plan. she agrees with it. she will call with any problems that may develop before the next visit here. Total time spent: 30 mins including face to face time and time spent for planning, charting and co-ordination of care   Harriette Ohara, MD 11/16/21    I Gardiner Coins am scribing for Dr. Lindi Adie  I have reviewed the above documentation for accuracy and completeness, and I agree with the above.

## 2021-11-02 NOTE — Interval H&P Note (Signed)
History and Physical Interval Note:no change in  H and P  11/02/2021 7:09 AM  Terri Montes  has presented today for surgery, with the diagnosis of RIGHT BREAST CANCER.  The various methods of treatment have been discussed with the patient and family. After consideration of risks, benefits and other options for treatment, the patient has consented to  Procedure(s): RIGHT BREAST LUMPECTOMY WITH RADIOACTIVE SEED AND SENTINEL LYMPH NODE BIOPSY (Right) as a surgical intervention.  The patient's history has been reviewed, patient examined, no change in status, stable for surgery.  I have reviewed the patient's chart and labs.  Questions were answered to the patient's satisfaction.     Coralie Keens

## 2021-11-02 NOTE — Discharge Instructions (Addendum)
[facility] °Office Phone Number [phone] ° °BREAST BIOPSY/ PARTIAL MASTECTOMY: POST OP INSTRUCTIONS ° °Always review your discharge instruction sheet given to you by the facility where your surgery was performed. ° °IF YOU HAVE DISABILITY OR FAMILY LEAVE FORMS, YOU MUST BRING THEM TO THE OFFICE FOR PROCESSING.  DO NOT GIVE THEM TO YOUR DOCTOR. ° °A prescription for pain medication may be given to you upon discharge.  Take your pain medication as prescribed, if needed.  If narcotic pain medicine is not needed, then you may take acetaminophen (Tylenol) or ibuprofen (Advil) as needed. °Take your usually prescribed medications unless otherwise directed °If you need a refill on your pain medication, please contact your pharmacy.  They will contact our office to request authorization.  Prescriptions will not be filled after 5pm or on week-ends. °You should eat very light the first 24 hours after surgery, such as soup, crackers, pudding, etc.  Resume your normal diet the day after surgery. °Most patients will experience some swelling and bruising in the breast.  Ice packs and a good support bra will help.  Swelling and bruising can take several days to resolve.  °It is common to experience some constipation if taking pain medication after surgery.  Increasing fluid intake and taking a stool softener will usually help or prevent this problem from occurring.  A mild laxative (Milk of Magnesia or Miralax) should be taken according to package directions if there are no bowel movements after 48 hours. °Unless discharge instructions indicate otherwise, you may remove your bandages 24-48 hours after surgery, and you may shower at that time.  You may have steri-strips (small skin tapes) in place directly over the incision.  These strips should be left on the skin for 7-10 days.  If your surgeon used skin glue on the incision, you may shower in 24 hours.  The glue will flake off over the next 2-3 weeks.  Any  sutures or staples will be removed at the office during your follow-up visit. °ACTIVITIES:  You may resume regular daily activities (gradually increasing) beginning the next day.  Wearing a good support bra or sports bra minimizes pain and swelling.  You may have sexual intercourse when it is comfortable. °You may drive when you no longer are taking prescription pain medication, you can comfortably wear a seatbelt, and you can safely maneuver your car and apply brakes. °RETURN TO WORK:  ______________________________________________________________________________________ °You should see your doctor in the office for a follow-up appointment approximately two weeks after your surgery.  Your doctor’s nurse will typically make your follow-up appointment when she calls you with your pathology report.  Expect your pathology report 2-3 business days after your surgery.  You may call to check if you do not hear from us after three days. °OTHER INSTRUCTIONS: OK TO REMOVE THE BINDER AND SHOWER STARTING TOMORROW °ICE PACK, TYLENOL, AND IBUPROFEN ALSO FOR PAIN °NO VIGOROUS ACTIVITY FOR ONE WEEK °_______________________________________________________________________________________________ _____________________________________________________________________________________________________________________________________ °_____________________________________________________________________________________________________________________________________ °_____________________________________________________________________________________________________________________________________ ° °WHEN TO CALL YOUR DOCTOR: °Fever over 101.0 °Nausea and/or vomiting. °Extreme swelling or bruising. °Continued bleeding from incision. °Increased pain, redness, or drainage from the incision. ° °The clinic staff is available to answer your questions during regular business hours.  Please don’t hesitate to call and ask to speak to one of the  nurses for clinical concerns.  If you have a medical emergency, go to the nearest emergency room or call 911.  A surgeon from [facility] is always on call at the hospital. ° °For   further questions, please visit [email or web address]    NO Tylenol or Ibuprofen/NSAIDS until after 1:30pm 11/02/21.   Post Anesthesia Home Care Instructions  Activity: Get plenty of rest for the remainder of the day. A responsible individual must stay with you for 24 hours following the procedure.  For the next 24 hours, DO NOT: -Drive a car -Paediatric nurse -Drink alcoholic beverages -Take any medication unless instructed by your physician -Make any legal decisions or sign important papers.  Meals: Start with liquid foods such as gelatin or soup. Progress to regular foods as tolerated. Avoid greasy, spicy, heavy foods. If nausea and/or vomiting occur, drink only clear liquids until the nausea and/or vomiting subsides. Call your physician if vomiting continues.  Special Instructions/Symptoms: Your throat may feel dry or sore from the anesthesia or the breathing tube placed in your throat during surgery. If this causes discomfort, gargle with warm salt water. The discomfort should disappear within 24 hours.  If you had a scopolamine patch placed behind your ear for the management of post- operative nausea and/or vomiting:  1. The medication in the patch is effective for 72 hours, after which it should be removed.  Wrap patch in a tissue and discard in the trash. Wash hands thoroughly with soap and water. 2. You may remove the patch earlier than 72 hours if you experience unpleasant side effects which may include dry mouth, dizziness or visual disturbances. 3. Avoid touching the patch. Wash your hands with soap and water after contact with the patch.

## 2021-11-02 NOTE — Anesthesia Preprocedure Evaluation (Signed)
Anesthesia Evaluation  Patient identified by MRN, date of birth, ID band Patient awake    Reviewed: Allergy & Precautions, H&P , NPO status , Patient's Chart, lab work & pertinent test results, reviewed documented beta blocker date and time   History of Anesthesia Complications (+) PONV  Airway Mallampati: II  TM Distance: >3 FB Neck ROM: full    Dental no notable dental hx. (+) Teeth Intact, Dental Advisory Given   Pulmonary neg pulmonary ROS,    Pulmonary exam normal breath sounds clear to auscultation       Cardiovascular Exercise Tolerance: Good hypertension, Pt. on medications negative cardio ROS   Rhythm:regular Rate:Normal     Neuro/Psych negative neurological ROS  negative psych ROS   GI/Hepatic negative GI ROS, Neg liver ROS,   Endo/Other  negative endocrine ROS  Renal/GU negative Renal ROS  negative genitourinary   Musculoskeletal   Abdominal   Peds  Hematology negative hematology ROS (+)   Anesthesia Other Findings   Reproductive/Obstetrics negative OB ROS                             Anesthesia Physical Anesthesia Plan  ASA: 3  Anesthesia Plan: General   Post-op Pain Management: Regional block*, Celebrex PO (pre-op)* and Tylenol PO (pre-op)*   Induction: Intravenous  PONV Risk Score and Plan: 3 and Ondansetron, Dexamethasone and Propofol infusion  Airway Management Planned: Oral ETT and LMA  Additional Equipment: None  Intra-op Plan:   Post-operative Plan: Extubation in OR  Informed Consent: I have reviewed the patients History and Physical, chart, labs and discussed the procedure including the risks, benefits and alternatives for the proposed anesthesia with the patient or authorized representative who has indicated his/her understanding and acceptance.     Dental Advisory Given  Plan Discussed with: CRNA and Anesthesiologist  Anesthesia Plan Comments:  (Discussed both nerve block for pain relief post-op and GA; including NV, sore throat, dental injury, and pulmonary complications)        Anesthesia Quick Evaluation

## 2021-11-02 NOTE — Op Note (Signed)
Preoperative diagnosis: 23m mass on screening mammography, biopsy demonstrated ER+/PR+ invasive ductal carcinoma.   Postoperative diagnosis: same   Procedure: RIGHT breast lumpectomy, sentinel node biopsy, magtrace injection for lymph node mapping  Surgeon: DCoralie Keens M.D.  Asst: Terri Ricker MD  Anesthesia: LMA General  Indications for procedure: Terri BIGLOWis a 61y.o. year old female with biopsy demonstrated 969minvasive ductal carcinoma ER+/PR+.  Description of procedure: The patient was brought into the operative suite. Anesthesia was administered with General LMA anesthesia. WHO checklist was applied. The patient was then placed in supine position. The area was prepped and draped in the usual sterile fashion.  A timeout was called confirming the patient, the laterality as RIGHT, and the procedure as breast lumpectomy with sentinel node biopsy, and magtrace injection for lymph node mapping.   Magtrace was injected into the breast and the breast was massaged for five minutes to assist with dissemination of the magtrace solution.   We began with our lumpectomy. We identified the previously placed radioactive seed using the neoprobe which yielded a signal greater than 9000. We injected local anesthetic. We made a periareolar incision and created a superior skin flap. We carried our dissection superiorly repeatedly confirming localization of the seed. We created superior, inferior, lateral, and medial dissection margins which we carried down deep to the chest wall and then divided the column of tissue posteriorly. We confirmed the seed signal was present in the specimen. We then applied paint to the specimen confirming the superior, inferior, medial, lateral, and anterior orientation. We confirmed hemostasis. We then closed the skin with interrupted 3-0 vicryls and 4-0 monocryl suture.   We then turned our attention to the axilla. Using the magtrace probe we identified a positive  signal in the axilla. We made a curvilinear incision along natural skin creases. We then carried our dissection down using the magtrace probe to identify areas of signal. We then resected two segments of axillary tissue which yielded notable magtrace signal. Both had palpable lymph nodes. We confirmed hemostasis. Skin was closed with 3-0 viryls and 4-0 monocryl.   Additional local anesthetic was applied to both wound beds.   Dermabond was applied to both incisions.   Findings: Lumpectomy specimen containing seed, magtrace positive signal in axillary tissue containing lymph nodes  Specimen: Lumpectomy  Implant: None   Blood loss: minimal  Local anesthesia:  24 ml marcaine   Complications: None  HaRadonna RickerM.D. PGY-5 [facility]

## 2021-11-03 LAB — SURGICAL PATHOLOGY

## 2021-11-09 ENCOUNTER — Encounter: Payer: Self-pay | Admitting: *Deleted

## 2021-11-09 ENCOUNTER — Telehealth: Payer: Self-pay | Admitting: *Deleted

## 2021-11-09 NOTE — Telephone Encounter (Signed)
Received order for oncotype testing. Requisition faxed to pathology and [facility] °

## 2021-11-16 ENCOUNTER — Other Ambulatory Visit: Payer: Managed Care, Other (non HMO)

## 2021-11-16 ENCOUNTER — Inpatient Hospital Stay: Payer: Managed Care, Other (non HMO) | Attending: Hematology and Oncology | Admitting: Hematology and Oncology

## 2021-11-16 ENCOUNTER — Other Ambulatory Visit: Payer: Self-pay

## 2021-11-16 DIAGNOSIS — C50211 Malignant neoplasm of upper-inner quadrant of right female breast: Secondary | ICD-10-CM | POA: Diagnosis not present

## 2021-11-16 DIAGNOSIS — Z170 Estrogen receptor positive status [ER+]: Secondary | ICD-10-CM | POA: Diagnosis not present

## 2021-11-16 DIAGNOSIS — Z79899 Other long term (current) drug therapy: Secondary | ICD-10-CM | POA: Diagnosis not present

## 2021-11-16 NOTE — Assessment & Plan Note (Signed)
09/22/2021:Screening mammogram detected right breast asymmetry spiculated mass 0.9 cm at 1 o'clock position, axilla negative, ultrasound-guided biopsy: Grade 1 IDC ER 100%, PR 100%, HER2 equivocal by IHC, FISH pending, Ki-67 less than 5%  11/02/21: Right Lumpectomy: Grade 1 IDC 1.1 cm, with DCIS, Margin neg, 0/2 LN Neg, ER 100%, PR 100%, HER2 equivocal by IHC, FISH pending, Ki-67 less than 5%  Pathology counseling: I discussed the final pathology report of the patient provided  a copy of this report. I discussed the margins as well as lymph node surgeries. We also discussed the final staging along with previously performed ER/PR and HER-2/neu testing.  Treatment Plan: 1. Oncotype DX testing if the final tumor size is greater than 1 cm and HER2 FISH test was negative. 2. Adjuvant radiation therapy followed by 3. Adjuvant antiestrogen therapy  RTC based on oncotype Dx

## 2021-11-21 ENCOUNTER — Encounter (HOSPITAL_COMMUNITY): Payer: Self-pay

## 2021-11-23 ENCOUNTER — Telehealth: Payer: Self-pay | Admitting: *Deleted

## 2021-11-23 ENCOUNTER — Other Ambulatory Visit: Payer: Self-pay | Admitting: *Deleted

## 2021-11-23 ENCOUNTER — Encounter: Payer: Self-pay | Admitting: *Deleted

## 2021-11-23 ENCOUNTER — Telehealth: Payer: Self-pay | Admitting: Radiation Oncology

## 2021-11-23 DIAGNOSIS — C50211 Malignant neoplasm of upper-inner quadrant of right female breast: Secondary | ICD-10-CM

## 2021-11-23 NOTE — Telephone Encounter (Signed)
Received oncotype results of 7/3%. Left message with results on patient's identified VM Referral placed for Dr. Lisbeth Renshaw.

## 2021-11-23 NOTE — Telephone Encounter (Signed)
6/29 @ 12:35 pm Left voicemail for patient to call our office.

## 2021-11-24 ENCOUNTER — Encounter (HOSPITAL_COMMUNITY): Payer: Self-pay

## 2021-11-30 ENCOUNTER — Ambulatory Visit: Payer: Managed Care, Other (non HMO) | Attending: Surgery | Admitting: Physical Therapy

## 2021-11-30 ENCOUNTER — Encounter: Payer: Self-pay | Admitting: *Deleted

## 2021-11-30 ENCOUNTER — Encounter: Payer: Self-pay | Admitting: Physical Therapy

## 2021-11-30 ENCOUNTER — Other Ambulatory Visit: Payer: Self-pay

## 2021-11-30 DIAGNOSIS — Z170 Estrogen receptor positive status [ER+]: Secondary | ICD-10-CM | POA: Insufficient documentation

## 2021-11-30 DIAGNOSIS — Z483 Aftercare following surgery for neoplasm: Secondary | ICD-10-CM | POA: Insufficient documentation

## 2021-11-30 DIAGNOSIS — C50211 Malignant neoplasm of upper-inner quadrant of right female breast: Secondary | ICD-10-CM | POA: Insufficient documentation

## 2021-11-30 DIAGNOSIS — R293 Abnormal posture: Secondary | ICD-10-CM | POA: Insufficient documentation

## 2021-11-30 NOTE — Therapy (Signed)
OUTPATIENT PHYSICAL THERAPY BREAST CANCER POST OP FOLLOW UP   Patient Name: Terri Montes MRN: [id number] DOB:[birth year], 61 y.o., female Today's Date: 11/30/2021   PT End of Session - 11/30/21 1109     Visit Number 2    Number of Visits 2    PT Start Time 1105    PT Stop Time 9563    PT Time Calculation (min) 40 min    Activity Tolerance Patient tolerated treatment well    Behavior During Therapy WFL for tasks assessed/performed             Past Medical History:  Diagnosis Date   Allergy    seasonal   Anxiety    Depression    Hypercholesteremia    Hypertension    PONV (postoperative nausea and vomiting)    Past Surgical History:  Procedure Laterality Date   ABDOMINAL HYSTERECTOMY  03/28/2010   partial   BREAST BIOPSY Right 2002   benign   BREAST LUMPECTOMY WITH RADIOACTIVE SEED AND SENTINEL LYMPH NODE BIOPSY Right 11/02/2021   Procedure: RIGHT BREAST LUMPECTOMY WITH RADIOACTIVE SEED AND SENTINEL LYMPH NODE BIOPSY;  Surgeon: Coralie Keens, MD;  Location: Agency;  Service: General;  Laterality: Right;   BREAST SURGERY Right 05/28/2000   benign breast biopsy   CESAREAN SECTION  04/27/1994   REPAIR PERONEAL TENDONS ANKLE Bilateral 2003, 2006   REPLACEMENT TOTAL KNEE Left 2021   Patient Active Problem List   Diagnosis Date Noted   Malignant neoplasm of upper-inner quadrant of right female breast ([place]) 10/02/2021   Keratoconus of both eyes 10/09/2013   Dependent edema 02/15/2013   Situational stress 08/16/2012   Anxiety state, unspecified 07/28/2012   Carbuncles 07/15/2012   Essential hypertension, benign 07/15/2012   History of migraine headaches 01/22/2011    REFERRING PROVIDER: Dr. Coralie Keens  REFERRING DIAG: Right breast cancer  THERAPY DIAG:  Malignant neoplasm of upper-inner quadrant of right breast in female, estrogen receptor positive ([place])  Abnormal posture  Aftercare following surgery for neoplasm  Rationale for  Evaluation and Treatment Rehabilitation  ONSET DATE: 11/02/2021  SUBJECTIVE:                                                                                                                                                                                           SUBJECTIVE STATEMENT: Patient reports she underwent a right lumpectomy and sentinel node biopsy (2 negative nodes) on 11/02/2021. Her Oncotype score was very low so she will proceed to radiation at [facility] followed by anti-estrogen therapy.  PERTINENT HISTORY:  Patient was diagnosed on 09/25/2021 with right grade I invasive ductal  carcinoma breast cancer. She underwent a right lumpectomy and sentinel node biopsy (2 negative nodes) on 11/02/2021. It is ER/PR positive and HER2 negative with a Ki67 of < 5%. She had a left total knee replacement in 08/2019.  PATIENT GOALS:  Reassess how my recovery is going related to arm function, pain, and swelling.  PAIN:  Are you having pain? No  PRECAUTIONS: Recent Surgery, right UE Lymphedema risk  ACTIVITY LEVEL / LEISURE: She is walking daily for 30 minutes broken into several segments but is working toward consecutive walking for 30 min.   OBJECTIVE:   PATIENT SURVEYS:  QUICK DASH:  Quick Dash - 11/30/21 0001     Open a tight or new jar No difficulty    Do heavy household chores (wash walls, wash floors) No difficulty    Carry a shopping bag or briefcase No difficulty    Wash your back No difficulty    Use a knife to cut food No difficulty    Recreational activities in which you take some force or impact through your arm, shoulder, or hand (golf, hammering, tennis) No difficulty    During the past week, to what extent has your arm, shoulder or hand problem interfered with your normal social activities with family, friends, neighbors, or groups? Not at all    During the past week, to what extent has your arm, shoulder or hand problem limited your work or other regular daily activities Slightly     Arm, shoulder, or hand pain. Mild    Tingling (pins and needles) in your arm, shoulder, or hand Mild    Difficulty Sleeping No difficulty    DASH Score 6.82 %              OBSERVATIONS:  Right breast and axillary incisions both appear to be healing well and incisions have good mobility. No redness or edema present.  POSTURE:  Forward head and rounded shoulders posture  LYMPHEDEMA ASSESSMENT:   UPPER EXTREMITY AROM/PROM:   A/PROM RIGHT  10/04/2021   RIGHT 11/30/2021  Shoulder extension 51 45  Shoulder flexion 143 152  Shoulder abduction 154 159  Shoulder internal rotation 72 70  Shoulder external rotation 84 83                          (Blank rows = not tested)   A/PROM LEFT  10/04/2021  Shoulder extension 52  Shoulder flexion 140  Shoulder abduction 159  Shoulder internal rotation 68  Shoulder external rotation 71                          (Blank rows = not tested)     CERVICAL AROM: All within normal limits     UPPER EXTREMITY STRENGTH: WNL     LYMPHEDEMA ASSESSMENTS:    LANDMARK RIGHT  10/04/2021 RIGHT 11/30/2021  10 cm proximal to olecranon process 40.5 40.5  Olecranon process 31.4 32  10 cm proximal to ulnar styloid process 27.5 26.7  Just proximal to ulnar styloid process 18.5 18.8  Across hand at thumb web space 21.4 20.9  At base of 2nd digit 7.2 7  (Blank rows = not tested)   LANDMARK LEFT  10/04/2021 LEFT 11/30/2021  10 cm proximal to olecranon process 40 41  Olecranon process 32.4 32.3  10 cm proximal to ulnar styloid process 26.8 27.1  Just proximal to ulnar styloid process 18 18.7  Across hand at  thumb web space 21.8 21  At base of 2nd digit 7.1 7.1  (Blank rows = not tested)         Surgery type/Date: Right lumpectomy and sentinel node biopsy 11/02/2021 Number of lymph nodes removed: 2 Current/past treatment (chemo, radiation, hormone therapy): none Other symptoms:  Heaviness/tightness No Pain No Pitting edema No Infections No Decreased  scar mobility Yes Stemmer sign No   PATIENT EDUCATION:  Education details: HEP Person educated: Patient Education method: Customer service manager Education comprehension: verbalized understanding and returned demonstration   HOME EXERCISE PROGRAM:  Reviewed previously given post op HEP.  ASSESSMENT:  CLINICAL IMPRESSION: Patient is doing very well s/p right lumpectomy and sentinel node biopsy (2 negative nodes) on 11/02/2021. She is proceeding to radiation next week at [facility].  Pt will benefit from skilled therapeutic intervention to improve on the following deficits: Decreased knowledge of precautions, impaired UE functional use, pain, decreased ROM, postural dysfunction.   PT treatment/interventions: ADL/Self care home management, Therapeutic exercises, Patient/Family education, and Re-evaluation     GOALS: Goals reviewed with patient? Yes  LONG TERM GOALS:  (STG=LTG)  GOALS Name Target Date  Goal status  1 Pt will demonstrate she has regained full shoulder ROM and function post operatively compared to baselines.  Baseline: 11/29/2021 MET     PLAN: PT FREQUENCY/DURATION: N/A  PLAN FOR NEXT SESSION: D/C   [facility]  [address], [address]  [place] [address]  [phone]  After Breast Cancer Class It is recommended you attend the ABC class to be educated on lymphedema risk reduction. This class is free of charge and lasts for 1 hour. It is a 1-time class. You will need to download the Webex app either on your phone or computer. We will send you a link the night before or the morning of the class. You should be able to click on that link to join the class. This is not a confidential class. You don't have to turn your camera on, but other participants may be able to see your email address.  Scar massage You can begin gentle scar massage to you incision sites. Gently place one hand on the incision and move the skin (without sliding on the  skin) in various directions. Do this for a few minutes and then you can gently massage either coconut oil or vitamin E cream into the scars.  Compression garment You should continue wearing your compression bra until you feel like you no longer have swelling.  Home exercise Program Continue doing the exercises you were given until you feel like you can do them without feeling any tightness at the end.   Walking Program Studies show that 30 minutes of walking per day (fast enough to elevate your heart rate) can significantly reduce the risk of a cancer recurrence. If you can't walk due to other medical reasons, we encourage you to find another activity you could do (like a stationary bike or water exercise).  Posture After breast cancer surgery, people frequently sit with rounded shoulders posture because it puts their incisions on slack and feels better. If you sit like this and scar tissue forms in that position, you can become very tight and have pain sitting or standing with good posture. Try to be aware of your posture and sit and stand up tall to heal properly.  Follow up PT: It is recommended you return every 3 months for the first 3 years following surgery to be assessed on  the SOZO machine for an L-Dex score. This helps prevent clinically significant lymphedema in 95% of patients. These follow up screens are 10 minute appointments that you are not billed for.  PHYSICAL THERAPY DISCHARGE SUMMARY  Visits from Start of Care: 2  Current functional level related to goals / functional outcomes: Goals met; see above for objective findings   Remaining deficits: None   Education / Equipment: HEP and lymphedema education   Patient agrees to discharge. Patient goals were met. Patient is being discharged due to meeting the stated rehab goals.  Annia Friendly, [place] 11/30/21 11:51 AM

## 2021-11-30 NOTE — Patient Instructions (Signed)
Closed Chain: Shoulder Flexion / Extension - on Wall    Hands on wall, step backward. Return. Stepping causes shoulder flexion and extension Do ___ times, each foot, ___ times per day.  http://ss.exer.us/265   Copyright  VHI. All rights reserved.   Closed Chain: Shoulder Abduction / Adduction - on Wall    One hand on wall, step to side and return. Stepping causes shoulder to abduct and adduct. Step ___ times, each side, ___ times per day.  http://ss.exer.us/267   Copyright  VHI. All rights reserved.   [facility]  [address]  [place] [address]  [phone]  After Breast Cancer Class It is recommended you attend the ABC class to be educated on lymphedema risk reduction. This class is free of charge and lasts for 1 hour. It is a 1-time class. You will need to download the Webex app either on your phone or computer. We will send you a link the night before or the morning of the class. You should be able to click on that link to join the class. This is not a confidential class. You don't have to turn your camera on, but other participants may be able to see your email address.  Scar massage You can begin gentle scar massage to you incision sites. Gently place one hand on the incision and move the skin (without sliding on the skin) in various directions. Do this for a few minutes and then you can gently massage either coconut oil or vitamin E cream into the scars.  Compression garment You should continue wearing your compression bra until you feel like you no longer have swelling.  Home exercise Program Continue doing the exercises you were given until you feel like you can do them without feeling any tightness at the end.   Walking Program Studies show that 30 minutes of walking per day (fast enough to elevate your heart rate) can significantly reduce the risk of a cancer recurrence. If you can't walk due to other medical reasons, we  encourage you to find another activity you could do (like a stationary bike or water exercise).  Posture After breast cancer surgery, people frequently sit with rounded shoulders posture because it puts their incisions on slack and feels better. If you sit like this and scar tissue forms in that position, you can become very tight and have pain sitting or standing with good posture. Try to be aware of your posture and sit and stand up tall to heal properly.  Follow up PT: It is recommended you return every 3 months for the first 3 years following surgery to be assessed on the SOZO machine for an L-Dex score. This helps prevent clinically significant lymphedema in 95% of patients. These follow up screens are 10 minute appointments that you are not billed for.

## 2021-12-05 ENCOUNTER — Ambulatory Visit
Admission: RE | Admit: 2021-12-05 | Discharge: 2021-12-05 | Disposition: A | Discharge status: Home / Self Care | Payer: Managed Care, Other (non HMO) | Source: Ambulatory Visit | Attending: Radiation Oncology | Admitting: Radiation Oncology

## 2021-12-05 VITALS — BP 134/75 | HR 89 | Temp 97.8°F | Ht 65.0 in | Wt 247.6 lb

## 2021-12-05 DIAGNOSIS — I10 Essential (primary) hypertension: Secondary | ICD-10-CM | POA: Diagnosis not present

## 2021-12-05 DIAGNOSIS — C50211 Malignant neoplasm of upper-inner quadrant of right female breast: Secondary | ICD-10-CM | POA: Diagnosis present

## 2021-12-05 DIAGNOSIS — Z170 Estrogen receptor positive status [ER+]: Secondary | ICD-10-CM | POA: Insufficient documentation

## 2021-12-05 DIAGNOSIS — Z79899 Other long term (current) drug therapy: Secondary | ICD-10-CM | POA: Diagnosis not present

## 2021-12-05 DIAGNOSIS — Z801 Family history of malignant neoplasm of trachea, bronchus and lung: Secondary | ICD-10-CM | POA: Diagnosis not present

## 2021-12-05 DIAGNOSIS — E7800 Pure hypercholesterolemia, unspecified: Secondary | ICD-10-CM | POA: Diagnosis not present

## 2021-12-05 NOTE — Progress Notes (Signed)
NEW PATIENT EVALUATION  Name: Terri Montes  MRN: [id number]  Date:   12/05/2021     DOB: October 30, 1960   This 61 y.o. female patient presents to the clinic for initial evaluation of stage Ia (T1 cN0 M0) ER/PR positive HER2 negative invasive mammary carcinoma of the right breast status post wide local excision.  REFERRING PHYSICIAN: Elby Showers, MD  CHIEF COMPLAINT:  Chief Complaint  Patient presents with   Breast Cancer    Consult    DIAGNOSIS: The encounter diagnosis was Malignant neoplasm of upper-inner quadrant of right breast in female, estrogen receptor positive ([place]).   PREVIOUS INVESTIGATIONS:  Mammogram and ultrasound reviewed Clinical notes reviewed Pathology report reviewed  HPI: Patient is a 61 year old female presents with an abnormal mammogram of her right breast.  There was an indeterminate mass in the right breast at the 1 o'clock position measuring approximately 0.9 cm in greatest dimension.  Axilla was normal by ultrasound criteria.  She went on to have a ultrasound-guided biopsy which was positive for invasive mammary carcinoma.  She went on to have a wide local excision for grade 111 mm invasive mammary carcinoma ER/PR positive HER2/neu not overexpressed.  Margins were clear by 2 mm.  2 sentinel lymph nodes were examined and negative for metastatic disease.  She has done well postoperatively.  Her Oncotype DX shows a low risk for recurrence she will not have systemic chemotherapy.  She is seen today for radiation oncology opinion.  She is doing well still some slight breast tenderness no cough or bone pain.  PLANNED TREATMENT REGIMEN: Right hypofractionated whole breast radiation  PAST MEDICAL HISTORY:  has a past medical history of Allergy, Anxiety, Depression, Hypercholesteremia, Hypertension, and PONV (postoperative nausea and vomiting).    PAST SURGICAL HISTORY:  Past Surgical History:  Procedure Laterality Date   ABDOMINAL HYSTERECTOMY  03/28/2010    partial   BREAST BIOPSY Right 2002   benign   BREAST LUMPECTOMY WITH RADIOACTIVE SEED AND SENTINEL LYMPH NODE BIOPSY Right 11/02/2021   Procedure: RIGHT BREAST LUMPECTOMY WITH RADIOACTIVE SEED AND SENTINEL LYMPH NODE BIOPSY;  Surgeon: Coralie Keens, MD;  Location: [facility];  Service: General;  Laterality: Right;   BREAST SURGERY Right 05/28/2000   benign breast biopsy   CESAREAN SECTION  04/27/1994   REPAIR PERONEAL TENDONS ANKLE Bilateral 2003, 2006   REPLACEMENT TOTAL KNEE Left 2021    FAMILY HISTORY: family history includes Hypertension in her brother and mother; Lung cancer (age of onset: 65) in her father; Skin cancer in her paternal grandfather.  SOCIAL HISTORY:  reports that she has never smoked. She has never used smokeless tobacco. She reports that she does not drink alcohol and does not use drugs.  ALLERGIES: Sulfa antibiotics  MEDICATIONS:  Current Outpatient Medications  Medication Sig Dispense Refill   acetaminophen (TYLENOL) 325 MG tablet Take 650 mg by mouth every 6 (six) hours as needed for pain.     ALPRAZolam (XANAX) 0.5 MG tablet Take 1 tablet (0.5 mg total) by mouth 2 (two) times daily as needed for anxiety. 60 tablet 0   escitalopram (LEXAPRO) 20 MG tablet TAKE 1 TABLET BY MOUTH  DAILY 90 tablet 3   escitalopram (LEXAPRO) 20 MG tablet Take 1 tablet by mouth daily.     furosemide (LASIX) 20 MG tablet TAKE ONE TABLET EVERY DAY (Patient taking differently: Take 20 mg by mouth as needed.) 90 tablet 1   ibuprofen (ADVIL) 800 MG tablet Take by mouth.  ketoconazole (NIZORAL) 2 % cream Apply 1 application topically daily. (Patient taking differently: Apply 1 application  topically daily as needed (as needed).) 60 g 2   ketotifen (ZADITOR) 0.025 % ophthalmic solution Apply to eye.     olmesartan (BENICAR) 20 MG tablet Take 1 tablet (20 mg total) by mouth daily. 90 tablet 1   rosuvastatin (CRESTOR) 10 MG tablet Take 1 tablet (10 mg total) by mouth daily.  90 tablet 3   No current facility-administered medications for this encounter.    ECOG PERFORMANCE STATUS:  0 - Asymptomatic  REVIEW OF SYSTEMS: Patient denies any weight loss, fatigue, weakness, fever, chills or night sweats. Patient denies any loss of vision, blurred vision. Patient denies any ringing  of the ears or hearing loss. No irregular heartbeat. Patient denies heart murmur or history of fainting. Patient denies any chest pain or pain radiating to her upper extremities. Patient denies any shortness of breath, difficulty breathing at night, cough or hemoptysis. Patient denies any swelling in the lower legs. Patient denies any nausea vomiting, vomiting of blood, or coffee ground material in the vomitus. Patient denies any stomach pain. Patient states has had normal bowel movements no significant constipation or diarrhea. Patient denies any dysuria, hematuria or significant nocturia. Patient denies any problems walking, swelling in the joints or loss of balance. Patient denies any skin changes, loss of hair or loss of weight. Patient denies any excessive worrying or anxiety or significant depression. Patient denies any problems with insomnia. Patient denies excessive thirst, polyuria, polydipsia. Patient denies any swollen glands, patient denies easy bruising or easy bleeding. Patient denies any recent infections, allergies or URI. Patient "s visual fields have not changed significantly in recent time.   PHYSICAL EXAM: BP 134/75   Pulse 89   Temp 97.8 F (36.6 C)   Ht $R'5\' 5"'Pf$  (1.651 m)   Wt 247 lb 9.6 oz (112.3 kg)   BMI 41.20 kg/m  She has a wide local excision scar which is healed well.  She also has a axillary node incision also well-healed.  No dominant masses noted in either breast.  No axillary or supraclavicular adenopathy is appreciated.  Well-developed well-nourished patient in NAD. HEENT reveals PERLA, EOMI, discs not visualized.  Oral cavity is clear. No oral mucosal lesions are  identified. Neck is clear without evidence of cervical or supraclavicular adenopathy. Lungs are clear to A&P. Cardiac examination is essentially unremarkable with regular rate and rhythm without murmur rub or thrill. Abdomen is benign with no organomegaly or masses noted. Motor sensory and DTR levels are equal and symmetric in the upper and lower extremities. Cranial nerves II through XII are grossly intact. Proprioception is intact. No peripheral adenopathy or edema is identified. No motor or sensory levels are noted. Crude visual fields are within normal range.  LABORATORY DATA: Pathology reports reviewed    RADIOLOGY RESULTS: Mammogram and ultrasound reviewed compatible with above-stated findings   IMPRESSION: Stage Ia invasive mammary carcinoma of the right breast ER/PR positive status post wide local excision.  PLAN: At this time I recommended a 3-week course of hypofractionated whole breast radiation to her right breast.  Would also boost her scar another 1000 cGy using electron beam based on the close margin.  Risks and benefits of treatment including skin reaction fatigue alteration of blood counts possible inclusion of superficial lung all were reviewed in detail with the patient.  She seems to comprehend my treatment plan well.  I have personally set up and ordered CT  simulation for later this week.  Patient also will benefit from endocrine therapy after completion of radiation.      Noreene Filbert, MD

## 2021-12-08 ENCOUNTER — Ambulatory Visit
Admission: RE | Admit: 2021-12-08 | Discharge: 2021-12-08 | Disposition: A | Discharge status: Home / Self Care | Payer: Managed Care, Other (non HMO) | Source: Ambulatory Visit | Attending: Radiation Oncology | Admitting: Radiation Oncology

## 2021-12-08 ENCOUNTER — Other Ambulatory Visit: Payer: Self-pay | Admitting: *Deleted

## 2021-12-08 DIAGNOSIS — Z510 Encounter for antineoplastic radiation therapy: Secondary | ICD-10-CM | POA: Diagnosis present

## 2021-12-08 DIAGNOSIS — Z170 Estrogen receptor positive status [ER+]: Secondary | ICD-10-CM | POA: Diagnosis present

## 2021-12-08 DIAGNOSIS — C50211 Malignant neoplasm of upper-inner quadrant of right female breast: Secondary | ICD-10-CM | POA: Diagnosis present

## 2021-12-11 ENCOUNTER — Encounter: Payer: Self-pay | Admitting: *Deleted

## 2021-12-12 ENCOUNTER — Telehealth: Payer: Self-pay | Admitting: Hematology and Oncology

## 2021-12-12 DIAGNOSIS — Z510 Encounter for antineoplastic radiation therapy: Secondary | ICD-10-CM | POA: Diagnosis not present

## 2021-12-12 NOTE — Telephone Encounter (Signed)
.  Called patient to schedule appointment per 7/17 inbasket, patient is aware of date and time.   

## 2021-12-13 LAB — LIPID PANEL
Cholesterol: 140 (ref 0–200)
HDL: 50 (ref 35–70)
LDL Cholesterol: 65
LDl/HDL Ratio: 2.8
Triglycerides: 145 (ref 40–160)

## 2021-12-13 LAB — COMPREHENSIVE METABOLIC PANEL: eGFR: 69

## 2021-12-13 LAB — BASIC METABOLIC PANEL
Creatinine: 1 (ref 0.5–1.1)
Glucose: 99

## 2021-12-13 LAB — HEMOGLOBIN A1C: Hemoglobin A1C: 5.5

## 2021-12-14 ENCOUNTER — Ambulatory Visit: Admission: RE | Admit: 2021-12-14 | Payer: Managed Care, Other (non HMO) | Source: Ambulatory Visit

## 2021-12-14 DIAGNOSIS — Z510 Encounter for antineoplastic radiation therapy: Secondary | ICD-10-CM | POA: Diagnosis not present

## 2021-12-15 ENCOUNTER — Encounter: Payer: Self-pay | Admitting: Internal Medicine

## 2021-12-18 ENCOUNTER — Other Ambulatory Visit: Payer: Self-pay

## 2021-12-18 ENCOUNTER — Encounter: Payer: Self-pay | Admitting: Internal Medicine

## 2021-12-18 ENCOUNTER — Ambulatory Visit
Admission: RE | Admit: 2021-12-18 | Discharge: 2021-12-18 | Disposition: A | Discharge status: Home / Self Care | Payer: Managed Care, Other (non HMO) | Source: Ambulatory Visit | Attending: Radiation Oncology | Admitting: Radiation Oncology

## 2021-12-18 DIAGNOSIS — Z510 Encounter for antineoplastic radiation therapy: Secondary | ICD-10-CM | POA: Diagnosis not present

## 2021-12-18 LAB — RAD ONC ARIA SESSION SUMMARY
Course Elapsed Days: 0
Plan Fractions Treated to Date: 1
Plan Prescribed Dose Per Fraction: 2.66 Gy
Plan Total Fractions Prescribed: 16
Plan Total Prescribed Dose: 42.56 Gy
Reference Point Dosage Given to Date: 2.66 Gy
Reference Point Session Dosage Given: 2.66 Gy
Session Number: 1

## 2021-12-19 ENCOUNTER — Ambulatory Visit
Admission: RE | Admit: 2021-12-19 | Discharge: 2021-12-19 | Disposition: A | Discharge status: Home / Self Care | Payer: Managed Care, Other (non HMO) | Source: Ambulatory Visit | Attending: Radiation Oncology | Admitting: Radiation Oncology

## 2021-12-19 ENCOUNTER — Other Ambulatory Visit: Payer: Self-pay

## 2021-12-19 DIAGNOSIS — Z510 Encounter for antineoplastic radiation therapy: Secondary | ICD-10-CM | POA: Diagnosis not present

## 2021-12-19 LAB — RAD ONC ARIA SESSION SUMMARY
Course Elapsed Days: 1
Plan Fractions Treated to Date: 2
Plan Prescribed Dose Per Fraction: 2.66 Gy
Plan Total Fractions Prescribed: 16
Plan Total Prescribed Dose: 42.56 Gy
Reference Point Dosage Given to Date: 5.32 Gy
Reference Point Session Dosage Given: 2.66 Gy
Session Number: 2

## 2021-12-20 ENCOUNTER — Other Ambulatory Visit: Payer: Self-pay

## 2021-12-20 ENCOUNTER — Ambulatory Visit
Admission: RE | Admit: 2021-12-20 | Discharge: 2021-12-20 | Disposition: A | Discharge status: Home / Self Care | Payer: Managed Care, Other (non HMO) | Source: Ambulatory Visit | Attending: Radiation Oncology | Admitting: Radiation Oncology

## 2021-12-20 DIAGNOSIS — Z510 Encounter for antineoplastic radiation therapy: Secondary | ICD-10-CM | POA: Diagnosis not present

## 2021-12-20 LAB — RAD ONC ARIA SESSION SUMMARY
Course Elapsed Days: 2
Plan Fractions Treated to Date: 3
Plan Prescribed Dose Per Fraction: 2.66 Gy
Plan Total Fractions Prescribed: 16
Plan Total Prescribed Dose: 42.56 Gy
Reference Point Dosage Given to Date: 7.98 Gy
Reference Point Session Dosage Given: 2.66 Gy
Session Number: 3

## 2021-12-21 ENCOUNTER — Ambulatory Visit
Admission: RE | Admit: 2021-12-21 | Discharge: 2021-12-21 | Disposition: A | Discharge status: Home / Self Care | Payer: Managed Care, Other (non HMO) | Source: Ambulatory Visit | Attending: Radiation Oncology | Admitting: Radiation Oncology

## 2021-12-21 ENCOUNTER — Other Ambulatory Visit: Payer: Self-pay

## 2021-12-21 DIAGNOSIS — Z510 Encounter for antineoplastic radiation therapy: Secondary | ICD-10-CM | POA: Diagnosis not present

## 2021-12-21 LAB — RAD ONC ARIA SESSION SUMMARY
Course Elapsed Days: 3
Plan Fractions Treated to Date: 4
Plan Prescribed Dose Per Fraction: 2.66 Gy
Plan Total Fractions Prescribed: 16
Plan Total Prescribed Dose: 42.56 Gy
Reference Point Dosage Given to Date: 10.64 Gy
Reference Point Session Dosage Given: 2.66 Gy
Session Number: 4

## 2021-12-22 ENCOUNTER — Other Ambulatory Visit: Payer: Self-pay | Admitting: *Deleted

## 2021-12-22 ENCOUNTER — Other Ambulatory Visit: Payer: Self-pay

## 2021-12-22 ENCOUNTER — Ambulatory Visit
Admission: RE | Admit: 2021-12-22 | Discharge: 2021-12-22 | Disposition: A | Discharge status: Home / Self Care | Payer: Managed Care, Other (non HMO) | Source: Ambulatory Visit | Attending: Radiation Oncology | Admitting: Radiation Oncology

## 2021-12-22 DIAGNOSIS — Z510 Encounter for antineoplastic radiation therapy: Secondary | ICD-10-CM | POA: Diagnosis not present

## 2021-12-22 LAB — RAD ONC ARIA SESSION SUMMARY
Course Elapsed Days: 4
Plan Fractions Treated to Date: 5
Plan Prescribed Dose Per Fraction: 2.66 Gy
Plan Total Fractions Prescribed: 16
Plan Total Prescribed Dose: 42.56 Gy
Reference Point Dosage Given to Date: 13.3 Gy
Reference Point Session Dosage Given: 2.66 Gy
Session Number: 5

## 2021-12-25 ENCOUNTER — Ambulatory Visit
Admission: RE | Admit: 2021-12-25 | Discharge: 2021-12-25 | Disposition: A | Discharge status: Home / Self Care | Payer: Managed Care, Other (non HMO) | Source: Ambulatory Visit | Attending: Radiation Oncology | Admitting: Radiation Oncology

## 2021-12-25 ENCOUNTER — Other Ambulatory Visit: Payer: Self-pay

## 2021-12-25 DIAGNOSIS — Z510 Encounter for antineoplastic radiation therapy: Secondary | ICD-10-CM | POA: Diagnosis not present

## 2021-12-25 LAB — RAD ONC ARIA SESSION SUMMARY
Course Elapsed Days: 7
Plan Fractions Treated to Date: 6
Plan Prescribed Dose Per Fraction: 2.66 Gy
Plan Total Fractions Prescribed: 16
Plan Total Prescribed Dose: 42.56 Gy
Reference Point Dosage Given to Date: 15.96 Gy
Reference Point Session Dosage Given: 2.66 Gy
Session Number: 6

## 2021-12-26 ENCOUNTER — Other Ambulatory Visit: Payer: Self-pay

## 2021-12-26 ENCOUNTER — Ambulatory Visit
Admission: RE | Admit: 2021-12-26 | Discharge: 2021-12-26 | Disposition: A | Discharge status: Home / Self Care | Payer: Managed Care, Other (non HMO) | Source: Ambulatory Visit | Attending: Radiation Oncology | Admitting: Radiation Oncology

## 2021-12-26 DIAGNOSIS — C50211 Malignant neoplasm of upper-inner quadrant of right female breast: Secondary | ICD-10-CM | POA: Insufficient documentation

## 2021-12-26 DIAGNOSIS — Z170 Estrogen receptor positive status [ER+]: Secondary | ICD-10-CM | POA: Insufficient documentation

## 2021-12-26 DIAGNOSIS — Z510 Encounter for antineoplastic radiation therapy: Secondary | ICD-10-CM | POA: Insufficient documentation

## 2021-12-26 LAB — RAD ONC ARIA SESSION SUMMARY
Course Elapsed Days: 8
Plan Fractions Treated to Date: 7
Plan Prescribed Dose Per Fraction: 2.66 Gy
Plan Total Fractions Prescribed: 16
Plan Total Prescribed Dose: 42.56 Gy
Reference Point Dosage Given to Date: 18.62 Gy
Reference Point Session Dosage Given: 2.66 Gy
Session Number: 7

## 2021-12-27 ENCOUNTER — Ambulatory Visit
Admission: RE | Admit: 2021-12-27 | Discharge: 2021-12-27 | Disposition: A | Discharge status: Home / Self Care | Payer: Managed Care, Other (non HMO) | Source: Ambulatory Visit | Attending: Radiation Oncology | Admitting: Radiation Oncology

## 2021-12-27 ENCOUNTER — Inpatient Hospital Stay: Payer: Managed Care, Other (non HMO) | Attending: Hematology and Oncology

## 2021-12-27 ENCOUNTER — Other Ambulatory Visit: Payer: Self-pay

## 2021-12-27 DIAGNOSIS — C50211 Malignant neoplasm of upper-inner quadrant of right female breast: Secondary | ICD-10-CM | POA: Diagnosis not present

## 2021-12-27 DIAGNOSIS — Z510 Encounter for antineoplastic radiation therapy: Secondary | ICD-10-CM | POA: Insufficient documentation

## 2021-12-27 DIAGNOSIS — Z170 Estrogen receptor positive status [ER+]: Secondary | ICD-10-CM | POA: Insufficient documentation

## 2021-12-27 LAB — CBC
HCT: 39.1 % (ref 36.0–46.0)
Hemoglobin: 13.5 g/dL (ref 12.0–15.0)
MCH: 32.1 pg (ref 26.0–34.0)
MCHC: 34.5 g/dL (ref 30.0–36.0)
MCV: 93.1 fL (ref 80.0–100.0)
Platelets: 273 10*3/uL (ref 150–400)
RBC: 4.2 MIL/uL (ref 3.87–5.11)
RDW: 12.6 % (ref 11.5–15.5)
WBC: 6.5 10*3/uL (ref 4.0–10.5)
nRBC: 0 % (ref 0.0–0.2)

## 2021-12-27 LAB — RAD ONC ARIA SESSION SUMMARY
Course Elapsed Days: 9
Plan Fractions Treated to Date: 8
Plan Prescribed Dose Per Fraction: 2.66 Gy
Plan Total Fractions Prescribed: 16
Plan Total Prescribed Dose: 42.56 Gy
Reference Point Dosage Given to Date: 21.28 Gy
Reference Point Session Dosage Given: 2.66 Gy
Session Number: 8

## 2021-12-28 ENCOUNTER — Other Ambulatory Visit: Payer: Self-pay

## 2021-12-28 ENCOUNTER — Ambulatory Visit: Payer: Managed Care, Other (non HMO)

## 2021-12-28 ENCOUNTER — Ambulatory Visit
Admission: RE | Admit: 2021-12-28 | Discharge: 2021-12-28 | Disposition: A | Discharge status: Home / Self Care | Payer: Managed Care, Other (non HMO) | Source: Ambulatory Visit | Attending: Radiation Oncology | Admitting: Radiation Oncology

## 2021-12-28 DIAGNOSIS — C50211 Malignant neoplasm of upper-inner quadrant of right female breast: Secondary | ICD-10-CM | POA: Diagnosis not present

## 2021-12-28 LAB — RAD ONC ARIA SESSION SUMMARY
Course Elapsed Days: 10
Plan Fractions Treated to Date: 9
Plan Prescribed Dose Per Fraction: 2.66 Gy
Plan Total Fractions Prescribed: 16
Plan Total Prescribed Dose: 42.56 Gy
Reference Point Dosage Given to Date: 23.94 Gy
Reference Point Session Dosage Given: 2.66 Gy
Session Number: 9

## 2021-12-29 ENCOUNTER — Other Ambulatory Visit: Payer: Self-pay

## 2021-12-29 ENCOUNTER — Ambulatory Visit
Admission: RE | Admit: 2021-12-29 | Discharge: 2021-12-29 | Disposition: A | Discharge status: Home / Self Care | Payer: Managed Care, Other (non HMO) | Source: Ambulatory Visit | Attending: Radiation Oncology | Admitting: Radiation Oncology

## 2021-12-29 DIAGNOSIS — C50211 Malignant neoplasm of upper-inner quadrant of right female breast: Secondary | ICD-10-CM | POA: Diagnosis not present

## 2021-12-29 LAB — RAD ONC ARIA SESSION SUMMARY
Course Elapsed Days: 11
Plan Fractions Treated to Date: 10
Plan Prescribed Dose Per Fraction: 2.66 Gy
Plan Total Fractions Prescribed: 16
Plan Total Prescribed Dose: 42.56 Gy
Reference Point Dosage Given to Date: 26.6 Gy
Reference Point Session Dosage Given: 2.66 Gy
Session Number: 10

## 2022-01-01 ENCOUNTER — Ambulatory Visit
Admission: RE | Admit: 2022-01-01 | Discharge: 2022-01-01 | Disposition: A | Discharge status: Home / Self Care | Payer: Managed Care, Other (non HMO) | Source: Ambulatory Visit | Attending: Radiation Oncology | Admitting: Radiation Oncology

## 2022-01-01 ENCOUNTER — Other Ambulatory Visit: Payer: Self-pay

## 2022-01-01 DIAGNOSIS — C50211 Malignant neoplasm of upper-inner quadrant of right female breast: Secondary | ICD-10-CM | POA: Diagnosis not present

## 2022-01-01 LAB — RAD ONC ARIA SESSION SUMMARY
Course Elapsed Days: 14
Plan Fractions Treated to Date: 11
Plan Prescribed Dose Per Fraction: 2.66 Gy
Plan Total Fractions Prescribed: 16
Plan Total Prescribed Dose: 42.56 Gy
Reference Point Dosage Given to Date: 29.26 Gy
Reference Point Session Dosage Given: 2.66 Gy
Session Number: 11

## 2022-01-02 ENCOUNTER — Other Ambulatory Visit: Payer: Self-pay

## 2022-01-02 ENCOUNTER — Ambulatory Visit
Admission: RE | Admit: 2022-01-02 | Discharge: 2022-01-02 | Disposition: A | Discharge status: Home / Self Care | Payer: Managed Care, Other (non HMO) | Source: Ambulatory Visit | Attending: Radiation Oncology | Admitting: Radiation Oncology

## 2022-01-02 DIAGNOSIS — C50211 Malignant neoplasm of upper-inner quadrant of right female breast: Secondary | ICD-10-CM | POA: Diagnosis not present

## 2022-01-02 LAB — RAD ONC ARIA SESSION SUMMARY
Course Elapsed Days: 15
Plan Fractions Treated to Date: 12
Plan Prescribed Dose Per Fraction: 2.66 Gy
Plan Total Fractions Prescribed: 16
Plan Total Prescribed Dose: 42.56 Gy
Reference Point Dosage Given to Date: 31.92 Gy
Reference Point Session Dosage Given: 2.66 Gy
Session Number: 12

## 2022-01-03 ENCOUNTER — Ambulatory Visit
Admission: RE | Admit: 2022-01-03 | Discharge: 2022-01-03 | Disposition: A | Discharge status: Home / Self Care | Payer: Managed Care, Other (non HMO) | Source: Ambulatory Visit | Attending: Radiation Oncology | Admitting: Radiation Oncology

## 2022-01-03 ENCOUNTER — Other Ambulatory Visit: Payer: Self-pay

## 2022-01-03 DIAGNOSIS — C50211 Malignant neoplasm of upper-inner quadrant of right female breast: Secondary | ICD-10-CM | POA: Diagnosis not present

## 2022-01-03 LAB — RAD ONC ARIA SESSION SUMMARY
Course Elapsed Days: 16
Plan Fractions Treated to Date: 13
Plan Prescribed Dose Per Fraction: 2.66 Gy
Plan Total Fractions Prescribed: 16
Plan Total Prescribed Dose: 42.56 Gy
Reference Point Dosage Given to Date: 34.58 Gy
Reference Point Session Dosage Given: 2.66 Gy
Session Number: 13

## 2022-01-04 ENCOUNTER — Ambulatory Visit
Admission: RE | Admit: 2022-01-04 | Discharge: 2022-01-04 | Disposition: A | Discharge status: Home / Self Care | Payer: Managed Care, Other (non HMO) | Source: Ambulatory Visit | Attending: Radiation Oncology | Admitting: Radiation Oncology

## 2022-01-04 ENCOUNTER — Other Ambulatory Visit: Payer: Self-pay

## 2022-01-04 DIAGNOSIS — C50211 Malignant neoplasm of upper-inner quadrant of right female breast: Secondary | ICD-10-CM | POA: Diagnosis not present

## 2022-01-04 LAB — RAD ONC ARIA SESSION SUMMARY
Course Elapsed Days: 17
Plan Fractions Treated to Date: 14
Plan Prescribed Dose Per Fraction: 2.66 Gy
Plan Total Fractions Prescribed: 16
Plan Total Prescribed Dose: 42.56 Gy
Reference Point Dosage Given to Date: 37.24 Gy
Reference Point Session Dosage Given: 2.66 Gy
Session Number: 14

## 2022-01-05 ENCOUNTER — Ambulatory Visit
Admission: RE | Admit: 2022-01-05 | Discharge: 2022-01-05 | Disposition: A | Discharge status: Home / Self Care | Payer: Managed Care, Other (non HMO) | Source: Ambulatory Visit | Attending: Radiation Oncology | Admitting: Radiation Oncology

## 2022-01-05 ENCOUNTER — Other Ambulatory Visit: Payer: Self-pay | Admitting: *Deleted

## 2022-01-05 ENCOUNTER — Other Ambulatory Visit: Payer: Self-pay

## 2022-01-05 DIAGNOSIS — C50211 Malignant neoplasm of upper-inner quadrant of right female breast: Secondary | ICD-10-CM | POA: Diagnosis not present

## 2022-01-05 LAB — RAD ONC ARIA SESSION SUMMARY
Course Elapsed Days: 18
Plan Fractions Treated to Date: 15
Plan Prescribed Dose Per Fraction: 2.66 Gy
Plan Total Fractions Prescribed: 16
Plan Total Prescribed Dose: 42.56 Gy
Reference Point Dosage Given to Date: 39.9 Gy
Reference Point Session Dosage Given: 2.66 Gy
Session Number: 15

## 2022-01-08 ENCOUNTER — Ambulatory Visit
Admission: RE | Admit: 2022-01-08 | Discharge: 2022-01-08 | Disposition: A | Discharge status: Home / Self Care | Payer: Managed Care, Other (non HMO) | Source: Ambulatory Visit | Attending: Radiation Oncology | Admitting: Radiation Oncology

## 2022-01-08 ENCOUNTER — Other Ambulatory Visit: Payer: Self-pay

## 2022-01-08 DIAGNOSIS — C50211 Malignant neoplasm of upper-inner quadrant of right female breast: Secondary | ICD-10-CM | POA: Diagnosis not present

## 2022-01-08 LAB — RAD ONC ARIA SESSION SUMMARY
Course Elapsed Days: 21
Plan Fractions Treated to Date: 16
Plan Prescribed Dose Per Fraction: 2.66 Gy
Plan Total Fractions Prescribed: 16
Plan Total Prescribed Dose: 42.56 Gy
Reference Point Dosage Given to Date: 42.56 Gy
Reference Point Session Dosage Given: 2.66 Gy
Session Number: 16

## 2022-01-09 ENCOUNTER — Other Ambulatory Visit: Payer: Self-pay

## 2022-01-09 ENCOUNTER — Ambulatory Visit
Admission: RE | Admit: 2022-01-09 | Discharge: 2022-01-09 | Disposition: A | Discharge status: Home / Self Care | Payer: Managed Care, Other (non HMO) | Source: Ambulatory Visit | Attending: Radiation Oncology | Admitting: Radiation Oncology

## 2022-01-09 DIAGNOSIS — C50211 Malignant neoplasm of upper-inner quadrant of right female breast: Secondary | ICD-10-CM | POA: Diagnosis not present

## 2022-01-09 LAB — RAD ONC ARIA SESSION SUMMARY
Course Elapsed Days: 22
Plan Fractions Treated to Date: 1
Plan Prescribed Dose Per Fraction: 2 Gy
Plan Total Fractions Prescribed: 8
Plan Total Prescribed Dose: 16 Gy
Reference Point Dosage Given to Date: 44.56 Gy
Reference Point Session Dosage Given: 2 Gy
Session Number: 17

## 2022-01-10 ENCOUNTER — Other Ambulatory Visit: Payer: Self-pay

## 2022-01-10 ENCOUNTER — Inpatient Hospital Stay: Payer: Managed Care, Other (non HMO)

## 2022-01-10 ENCOUNTER — Ambulatory Visit
Admission: RE | Admit: 2022-01-10 | Discharge: 2022-01-10 | Disposition: A | Discharge status: Home / Self Care | Payer: Managed Care, Other (non HMO) | Source: Ambulatory Visit | Attending: Radiation Oncology | Admitting: Radiation Oncology

## 2022-01-10 DIAGNOSIS — C50211 Malignant neoplasm of upper-inner quadrant of right female breast: Secondary | ICD-10-CM

## 2022-01-10 LAB — RAD ONC ARIA SESSION SUMMARY
Course Elapsed Days: 23
Plan Fractions Treated to Date: 2
Plan Prescribed Dose Per Fraction: 2 Gy
Plan Total Fractions Prescribed: 8
Plan Total Prescribed Dose: 16 Gy
Reference Point Dosage Given to Date: 46.56 Gy
Reference Point Session Dosage Given: 2 Gy
Session Number: 18

## 2022-01-10 LAB — CBC
HCT: 39.7 % (ref 36.0–46.0)
Hemoglobin: 13.4 g/dL (ref 12.0–15.0)
MCH: 31.4 pg (ref 26.0–34.0)
MCHC: 33.8 g/dL (ref 30.0–36.0)
MCV: 93 fL (ref 80.0–100.0)
Platelets: 278 10*3/uL (ref 150–400)
RBC: 4.27 MIL/uL (ref 3.87–5.11)
RDW: 12.6 % (ref 11.5–15.5)
WBC: 6.8 10*3/uL (ref 4.0–10.5)
nRBC: 0 % (ref 0.0–0.2)

## 2022-01-11 ENCOUNTER — Other Ambulatory Visit: Payer: Self-pay

## 2022-01-11 ENCOUNTER — Ambulatory Visit
Admission: RE | Admit: 2022-01-11 | Discharge: 2022-01-11 | Disposition: A | Discharge status: Home / Self Care | Payer: Managed Care, Other (non HMO) | Source: Ambulatory Visit | Attending: Radiation Oncology | Admitting: Radiation Oncology

## 2022-01-11 DIAGNOSIS — C50211 Malignant neoplasm of upper-inner quadrant of right female breast: Secondary | ICD-10-CM | POA: Diagnosis not present

## 2022-01-11 LAB — RAD ONC ARIA SESSION SUMMARY
Course Elapsed Days: 24
Plan Fractions Treated to Date: 3
Plan Prescribed Dose Per Fraction: 2 Gy
Plan Total Fractions Prescribed: 8
Plan Total Prescribed Dose: 16 Gy
Reference Point Dosage Given to Date: 48.56 Gy
Reference Point Session Dosage Given: 2 Gy
Session Number: 19

## 2022-01-12 ENCOUNTER — Other Ambulatory Visit: Payer: Self-pay

## 2022-01-12 ENCOUNTER — Ambulatory Visit
Admission: RE | Admit: 2022-01-12 | Discharge: 2022-01-12 | Disposition: A | Discharge status: Home / Self Care | Payer: Managed Care, Other (non HMO) | Source: Ambulatory Visit | Attending: Radiation Oncology | Admitting: Radiation Oncology

## 2022-01-12 DIAGNOSIS — C50211 Malignant neoplasm of upper-inner quadrant of right female breast: Secondary | ICD-10-CM | POA: Diagnosis not present

## 2022-01-12 LAB — RAD ONC ARIA SESSION SUMMARY
Course Elapsed Days: 25
Plan Fractions Treated to Date: 4
Plan Prescribed Dose Per Fraction: 2 Gy
Plan Total Fractions Prescribed: 8
Plan Total Prescribed Dose: 16 Gy
Reference Point Dosage Given to Date: 50.56 Gy
Reference Point Session Dosage Given: 2 Gy
Session Number: 20

## 2022-01-15 ENCOUNTER — Ambulatory Visit
Admission: RE | Admit: 2022-01-15 | Discharge: 2022-01-15 | Disposition: A | Discharge status: Home / Self Care | Payer: Managed Care, Other (non HMO) | Source: Ambulatory Visit | Attending: Radiation Oncology | Admitting: Radiation Oncology

## 2022-01-15 ENCOUNTER — Other Ambulatory Visit: Payer: Self-pay

## 2022-01-15 DIAGNOSIS — C50211 Malignant neoplasm of upper-inner quadrant of right female breast: Secondary | ICD-10-CM | POA: Diagnosis not present

## 2022-01-15 LAB — RAD ONC ARIA SESSION SUMMARY
Course Elapsed Days: 28
Plan Fractions Treated to Date: 5
Plan Prescribed Dose Per Fraction: 2 Gy
Plan Total Fractions Prescribed: 8
Plan Total Prescribed Dose: 16 Gy
Reference Point Dosage Given to Date: 52.56 Gy
Reference Point Session Dosage Given: 2 Gy
Session Number: 21

## 2022-01-16 ENCOUNTER — Ambulatory Visit
Admission: RE | Admit: 2022-01-16 | Discharge: 2022-01-16 | Disposition: A | Discharge status: Home / Self Care | Payer: Managed Care, Other (non HMO) | Source: Ambulatory Visit | Attending: Radiation Oncology | Admitting: Radiation Oncology

## 2022-01-16 ENCOUNTER — Other Ambulatory Visit: Payer: Self-pay

## 2022-01-16 DIAGNOSIS — C50211 Malignant neoplasm of upper-inner quadrant of right female breast: Secondary | ICD-10-CM | POA: Diagnosis not present

## 2022-01-16 LAB — RAD ONC ARIA SESSION SUMMARY
Course Elapsed Days: 29
Plan Fractions Treated to Date: 6
Plan Prescribed Dose Per Fraction: 2 Gy
Plan Total Fractions Prescribed: 8
Plan Total Prescribed Dose: 16 Gy
Reference Point Dosage Given to Date: 54.56 Gy
Reference Point Session Dosage Given: 2 Gy
Session Number: 22

## 2022-01-17 ENCOUNTER — Encounter: Payer: Self-pay | Admitting: *Deleted

## 2022-01-17 ENCOUNTER — Ambulatory Visit
Admission: RE | Admit: 2022-01-17 | Discharge: 2022-01-17 | Disposition: A | Discharge status: Home / Self Care | Payer: Managed Care, Other (non HMO) | Source: Ambulatory Visit | Attending: Radiation Oncology | Admitting: Radiation Oncology

## 2022-01-17 ENCOUNTER — Other Ambulatory Visit: Payer: Self-pay

## 2022-01-17 DIAGNOSIS — Z170 Estrogen receptor positive status [ER+]: Secondary | ICD-10-CM

## 2022-01-17 DIAGNOSIS — C50211 Malignant neoplasm of upper-inner quadrant of right female breast: Secondary | ICD-10-CM | POA: Diagnosis not present

## 2022-01-17 LAB — RAD ONC ARIA SESSION SUMMARY
Course Elapsed Days: 30
Plan Fractions Treated to Date: 7
Plan Prescribed Dose Per Fraction: 2 Gy
Plan Total Fractions Prescribed: 8
Plan Total Prescribed Dose: 16 Gy
Reference Point Dosage Given to Date: 56.56 Gy
Reference Point Session Dosage Given: 2 Gy
Session Number: 23

## 2022-01-18 ENCOUNTER — Other Ambulatory Visit: Payer: Self-pay

## 2022-01-18 ENCOUNTER — Ambulatory Visit
Admission: RE | Admit: 2022-01-18 | Discharge: 2022-01-18 | Disposition: A | Discharge status: Home / Self Care | Payer: Managed Care, Other (non HMO) | Source: Ambulatory Visit | Attending: Radiation Oncology | Admitting: Radiation Oncology

## 2022-01-18 DIAGNOSIS — C50211 Malignant neoplasm of upper-inner quadrant of right female breast: Secondary | ICD-10-CM | POA: Diagnosis not present

## 2022-01-18 LAB — RAD ONC ARIA SESSION SUMMARY
Course Elapsed Days: 31
Plan Fractions Treated to Date: 8
Plan Prescribed Dose Per Fraction: 2 Gy
Plan Total Fractions Prescribed: 8
Plan Total Prescribed Dose: 16 Gy
Reference Point Dosage Given to Date: 58.56 Gy
Reference Point Session Dosage Given: 2 Gy
Session Number: 24

## 2022-01-22 ENCOUNTER — Inpatient Hospital Stay: Payer: Managed Care, Other (non HMO) | Admitting: Hematology and Oncology

## 2022-01-24 ENCOUNTER — Other Ambulatory Visit: Payer: Self-pay | Admitting: Internal Medicine

## 2022-01-24 NOTE — Progress Notes (Signed)
Patient Care Team: Nicholas Lose, MD as PCP - General (Hematology and Oncology) Coralie Keens, MD as Consulting Physician (General Surgery) Nicholas Lose, MD as Consulting Physician (Hematology and Oncology) Kyung Rudd, MD as Consulting Physician (Radiation Oncology) Mauro Kaufmann, RN as Oncology Nurse Navigator Rockwell Germany, RN as Oncology Nurse Navigator  DIAGNOSIS:  Encounter Diagnoses  Name Primary?   Malignant neoplasm of upper-inner quadrant of right breast in female, estrogen receptor positive ([place])    Post-menopausal Yes    SUMMARY OF ONCOLOGIC HISTORY: Oncology History  Malignant neoplasm of upper-inner quadrant of right female breast ([place])  09/22/2021 Initial Diagnosis   Screening mammogram detected right breast asymmetry spiculated mass 0.9 cm at 1 o'clock position, axilla negative, ultrasound-guided biopsy: Grade 1 IDC ER 100%, PR 100%, HER2 equivocal by IHC, FISH pending, Ki-67 less than 5%   11/02/2021 Surgery   BREAST, RIGHT, LUMPECTOMY:pT1c, pN0(sn)    11/15/2021 Oncotype testing   Oncotype DX Breast Recurrence Score: 7 (ROR 3 %)   12/18/2021 - 01/18/2022 Radiation Therapy   Adjuvant radiation     CHIEF COMPLIANT: Follow-up to discuss estrogen  INTERVAL HISTORY: Terri Montes is a 61 y.o with right lumpectomy breast cancer. She presents to the clinic today for a follow-up. She states radiation went good just have a little radiation dermatitis and some common itching.   ALLERGIES:  is allergic to sulfa antibiotics.  MEDICATIONS:  Current Outpatient Medications  Medication Sig Dispense Refill   acetaminophen (TYLENOL) 325 MG tablet Take 650 mg by mouth every 6 (six) hours as needed for pain.     ALPRAZolam (XANAX) 0.5 MG tablet Take 1 tablet (0.5 mg total) by mouth 2 (two) times daily as needed for anxiety. 60 tablet 0   escitalopram (LEXAPRO) 20 MG tablet TAKE 1 TABLET BY MOUTH  DAILY 90 tablet 3   furosemide (LASIX) 20 MG tablet TAKE ONE TABLET  EVERY DAY (Patient taking differently: Take 20 mg by mouth as needed.) 90 tablet 1   ketoconazole (NIZORAL) 2 % cream Apply 1 application topically daily. (Patient taking differently: Apply 1 application  topically daily as needed (as needed).) 60 g 2   ketotifen (ZADITOR) 0.025 % ophthalmic solution Apply to eye.     olmesartan (BENICAR) 20 MG tablet Take 1 tablet (20 mg total) by mouth daily. 90 tablet 1   rosuvastatin (CRESTOR) 10 MG tablet Take 1 tablet (10 mg total) by mouth daily. 90 tablet 3   escitalopram (LEXAPRO) 20 MG tablet Take 1 tablet by mouth daily.     ibuprofen (ADVIL) 800 MG tablet Take by mouth.     No current facility-administered medications for this visit.    PHYSICAL EXAMINATION: ECOG PERFORMANCE STATUS: 1 - Symptomatic but completely ambulatory  Vitals:   01/25/22 0816  BP: (!) 143/78  Pulse: 89  Resp: 18  Temp: 97.7 F (36.5 C)  SpO2: 96%   Filed Weights   01/25/22 0816  Weight: 245 lb 6.4 oz (111.3 kg)      LABORATORY DATA:  I have reviewed the data as listed    Latest Ref Rng & Units 12/13/2021   12:00 AM 11/01/2021   11:39 AM 10/04/2021   12:18 PM  CMP  Glucose 70 - 99 mg/dL  94  97   BUN 6 - 20 mg/dL  9  14   Creatinine 0.5 - 1.1 1.0     1.05  1.04   Sodium 135 - 145 mmol/L  142  143  Potassium 3.5 - 5.1 mmol/L  4.1  3.7   Chloride 98 - 111 mmol/L  108  106   CO2 22 - 32 mmol/L  28  32   Calcium 8.9 - 10.3 mg/dL  9.5  9.5   Total Protein 6.5 - 8.1 g/dL   7.7   Total Bilirubin 0.3 - 1.2 mg/dL   0.5   Alkaline Phos 38 - 126 U/L   87   AST 15 - 41 U/L   17   ALT 0 - 44 U/L   13      This result is from an external source.    Lab Results  Component Value Date   WBC 6.8 01/10/2022   HGB 13.4 01/10/2022   HCT 39.7 01/10/2022   MCV 93.0 01/10/2022   PLT 278 01/10/2022   NEUTROABS 3.3 10/04/2021    ASSESSMENT & PLAN:  Malignant neoplasm of upper-inner quadrant of right female breast ([place]) 09/22/2021:Screening mammogram detected right  breast asymmetry spiculated mass 0.9 cm at 1 o'clock position, axilla negative, ultrasound-guided biopsy: Grade 1 IDC ER 100%, PR 100%, HER2 equivocal by IHC, FISH pending, Ki-67 less than 5%   11/02/21: Right Lumpectomy:  Grade 1 IDC 1.1 cm, with DCIS, Margin neg, 0/2 LN Neg, ER 100%, PR 100%, HER2 equivocal by IHC, FISH pending, Ki-67 less than 5% Oncotype recurrence score 7: ROR: 3%   Adjuvant radiation: 12/18/2021-01/18/2022  Treatment plan: Adjuvant antiestrogen therapy with anastrozole 1 mg daily  Anastrozole counseling: We discussed the risks and benefits of anti-estrogen therapy with aromatase inhibitors. These include but not limited to insomnia, hot flashes, mood changes, vaginal dryness, bone density loss, and weight gain. We strongly believe that the benefits far outweigh the risks. Patient understands these risks and consented to starting treatment. Planned treatment duration is 5-7 years.  Return to clinic in 3 months for survivorship care plan visit    Orders Placed This Encounter  Procedures   DG Bone Density    Standing Status:   Future    Standing Expiration Date:   01/26/2023    Order Specific Question:   Reason for Exam (SYMPTOM  OR DIAGNOSIS REQUIRED)    Answer:   post menopausal    Order Specific Question:   Is the patient pregnant?    Answer:   No    Order Specific Question:   Preferred imaging location?    Answer:   [facility]   FSH-Follicle stimulating hormone    Standing Status:   Future    Number of Occurrences:   1    Standing Expiration Date:   01/25/2023   Estradiol, Sensitive    Standing Status:   Future    Number of Occurrences:   1    Standing Expiration Date:   01/25/2023   The patient has a good understanding of the overall plan. she agrees with it. she will call with any problems that may develop before the next visit here. Total time spent: 30 mins including face to face time and time spent for planning, charting and co-ordination of  care   Harriette Ohara, MD 01/25/22    I Gardiner Coins am scribing for Dr. Lindi Adie  I have reviewed the above documentation for accuracy and completeness, and I agree with the above.

## 2022-01-25 ENCOUNTER — Inpatient Hospital Stay: Payer: Managed Care, Other (non HMO)

## 2022-01-25 ENCOUNTER — Inpatient Hospital Stay (HOSPITAL_BASED_OUTPATIENT_CLINIC_OR_DEPARTMENT_OTHER): Payer: Managed Care, Other (non HMO) | Admitting: Hematology and Oncology

## 2022-01-25 VITALS — BP 143/78 | HR 89 | Temp 97.7°F | Resp 18 | Ht 65.0 in | Wt 245.4 lb

## 2022-01-25 DIAGNOSIS — Z780 Asymptomatic menopausal state: Secondary | ICD-10-CM

## 2022-01-25 DIAGNOSIS — Z170 Estrogen receptor positive status [ER+]: Secondary | ICD-10-CM

## 2022-01-25 DIAGNOSIS — C50211 Malignant neoplasm of upper-inner quadrant of right female breast: Secondary | ICD-10-CM

## 2022-01-25 NOTE — Assessment & Plan Note (Signed)
09/22/2021:Screening mammogram detected right breast asymmetry spiculated mass 0.9 cm at 1 o'clock position, axilla negative, ultrasound-guided biopsy: Grade 1 IDC ER 100%, PR 100%, HER2 equivocal by IHC, FISH pending, Ki-67 less than 5%  11/02/21: Right Lumpectomy: Grade 1 IDC 1.1 cm, with DCIS, Margin neg, 0/2 LN Neg, ER 100%, PR 100%, HER2 equivocal by IHC, FISH pending, Ki-67 less than 5% Oncotype recurrence score 7: ROR: 3%   Adjuvant radiation: 12/18/2021-01/18/2022  Treatment plan: Adjuvant antiestrogen therapy with anastrozole 1 mg daily  Anastrozole counseling: We discussed the risks and benefits of anti-estrogen therapy with aromatase inhibitors. These include but not limited to insomnia, hot flashes, mood changes, vaginal dryness, bone density loss, and weight gain. We strongly believe that the benefits far outweigh the risks. Patient understands these risks and consented to starting treatment. Planned treatment duration is 5-7 years.  Return to clinic in 3 months for survivorship care plan visit

## 2022-01-26 LAB — FOLLICLE STIMULATING HORMONE: FSH: 63.8 m[IU]/mL

## 2022-01-31 LAB — ESTRADIOL, ULTRA SENS: Estradiol, Sensitive: 4 pg/mL

## 2022-02-05 ENCOUNTER — Ambulatory Visit: Payer: Self-pay

## 2022-02-13 ENCOUNTER — Encounter: Payer: Self-pay | Admitting: Hematology and Oncology

## 2022-02-13 ENCOUNTER — Other Ambulatory Visit: Payer: Self-pay

## 2022-02-13 MED ORDER — ANASTROZOLE 1 MG PO TABS: 1.0000 mg | ORAL_TABLET | Freq: Every day | ORAL | 3 refills | Status: DC

## 2022-02-26 ENCOUNTER — Telehealth: Payer: Self-pay | Admitting: Radiation Oncology

## 2022-02-26 NOTE — Telephone Encounter (Signed)
Pt left VM stating she needs to reschedule her upcoming appts with Dr. Baruch Gouty

## 2022-03-01 ENCOUNTER — Ambulatory Visit: Payer: Managed Care, Other (non HMO) | Admitting: Radiation Oncology

## 2022-03-12 ENCOUNTER — Ambulatory Visit
Admission: RE | Admit: 2022-03-12 | Discharge: 2022-03-12 | Disposition: A | Discharge status: Home / Self Care | Payer: Managed Care, Other (non HMO) | Source: Ambulatory Visit | Attending: Radiation Oncology | Admitting: Radiation Oncology

## 2022-03-12 VITALS — BP 143/76 | HR 87 | Ht 65.0 in | Wt 245.0 lb

## 2022-03-12 DIAGNOSIS — Z923 Personal history of irradiation: Secondary | ICD-10-CM | POA: Insufficient documentation

## 2022-03-12 DIAGNOSIS — Z79811 Long term (current) use of aromatase inhibitors: Secondary | ICD-10-CM | POA: Diagnosis not present

## 2022-03-12 DIAGNOSIS — Z170 Estrogen receptor positive status [ER+]: Secondary | ICD-10-CM | POA: Diagnosis not present

## 2022-03-12 DIAGNOSIS — C50211 Malignant neoplasm of upper-inner quadrant of right female breast: Secondary | ICD-10-CM | POA: Insufficient documentation

## 2022-03-12 NOTE — Progress Notes (Signed)
Radiation Oncology Follow up Note  Name: Terri Montes   Date:   03/12/2022 MRN:  [id number] DOB: 1960/11/27    This 61 y.o. female presents to the clinic today for 1 month follow-up status post whole breast radiation for stage Ia (T1 cN0 M0) ER/PR positive invasive mammary carcinoma right breast status post wide local excision.  REFERRING PROVIDER: Elby Showers, MD  HPI: Patient is a 61 year old female now at 1 month of completed whole breast radiation to her right breast for stage Ia ER positive invasive mammary carcinoma seen today in routine follow-up she is doing well she feels there is some enlargement of her right breast that feels a little heavier I explained these are the changes associated with radiation and her loss of ductal tissue.  She otherwise specifically denies cough or bone pain.  She has been started on.  Arimidex tolerating it well without side effect.  COMPLICATIONS OF TREATMENT: none  FOLLOW UP COMPLIANCE: keeps appointments   PHYSICAL EXAM:  BP (!) 143/76   Pulse 87   Ht '5\' 5"'$  (1.651 m)   Wt 245 lb (111.1 kg)   BMI 40.77 kg/m  Lungs are clear to A&P cardiac examination essentially unremarkable with regular rate and rhythm. No dominant mass or nodularity is noted in either breast in 2 positions examined. Incision is well-healed. No axillary or supraclavicular adenopathy is appreciated. Cosmetic result is excellent.  Well-developed well-nourished patient in NAD. HEENT reveals PERLA, EOMI, discs not visualized.  Oral cavity is clear. No oral mucosal lesions are identified. Neck is clear without evidence of cervical or supraclavicular adenopathy. Lungs are clear to A&P. Cardiac examination is essentially unremarkable with regular rate and rhythm without murmur rub or thrill. Abdomen is benign with no organomegaly or masses noted. Motor sensory and DTR levels are equal and symmetric in the upper and lower extremities. Cranial nerves II through XII are grossly intact.  Proprioception is intact. No peripheral adenopathy or edema is identified. No motor or sensory levels are noted. Crude visual fields are within normal range.  RADIOLOGY RESULTS: No current films for review  PLAN: Present time patient is doing well very low side effect profile from her recent whole breast radiation and pleased with her overall progress.  Of asked to see her back in 6 months for follow-up.  She continues on Arimidex without side effect.  Patient is to call with any concerns.  I would like to take this opportunity to thank you for allowing me to participate in the care of your patient.Noreene Filbert, MD

## 2022-03-15 LAB — CBC AND DIFFERENTIAL
HCT: 42 (ref 36–46)
Hemoglobin: 13.9 (ref 12.0–16.0)
Neutrophils Absolute: 64
Platelets: 294 10*3/uL (ref 150–400)
WBC: 4.7

## 2022-03-15 LAB — LIPID PANEL
Cholesterol: 208 — AB (ref 0–200)
HDL: 44 (ref 35–70)
LDL Cholesterol: 125
Triglycerides: 219 — AB (ref 40–160)

## 2022-03-15 LAB — CBC: RBC: 4.5 (ref 3.87–5.11)

## 2022-03-15 LAB — HEMOGLOBIN A1C: Hemoglobin A1C: 5.6

## 2022-03-15 LAB — TSH: TSH: 1.12 (ref 0.41–5.90)

## 2022-03-16 ENCOUNTER — Ambulatory Visit: Payer: Managed Care, Other (non HMO) | Admitting: Internal Medicine

## 2022-03-16 ENCOUNTER — Encounter: Payer: Self-pay | Admitting: Internal Medicine

## 2022-03-16 VITALS — BP 134/82 | HR 82 | Temp 98.2°F | Ht 63.0 in | Wt 245.8 lb

## 2022-03-16 DIAGNOSIS — I10 Essential (primary) hypertension: Secondary | ICD-10-CM

## 2022-03-16 DIAGNOSIS — E781 Pure hyperglyceridemia: Secondary | ICD-10-CM

## 2022-03-16 DIAGNOSIS — Z0000 Encounter for general adult medical examination without abnormal findings: Secondary | ICD-10-CM | POA: Diagnosis not present

## 2022-03-16 DIAGNOSIS — R5383 Other fatigue: Secondary | ICD-10-CM

## 2022-03-16 DIAGNOSIS — Z79899 Other long term (current) drug therapy: Secondary | ICD-10-CM

## 2022-03-16 DIAGNOSIS — F439 Reaction to severe stress, unspecified: Secondary | ICD-10-CM

## 2022-03-16 DIAGNOSIS — D0511 Intraductal carcinoma in situ of right breast: Secondary | ICD-10-CM

## 2022-03-16 DIAGNOSIS — E782 Mixed hyperlipidemia: Secondary | ICD-10-CM | POA: Diagnosis not present

## 2022-03-16 LAB — POCT URINALYSIS DIPSTICK
Bilirubin, UA: NEGATIVE
Glucose, UA: NEGATIVE
Ketones, UA: NEGATIVE
Leukocytes, UA: NEGATIVE
Nitrite, UA: NEGATIVE
Protein, UA: NEGATIVE
Spec Grav, UA: 1.01 (ref 1.010–1.025)
Urobilinogen, UA: 0.2 E.U./dL
pH, UA: 6 (ref 5.0–8.0)

## 2022-03-16 NOTE — Patient Instructions (Addendum)
We can increase lipid lowering med if needed. We will recheck lipids in  April. Vaccines discussed.  Suggest Pneumonia 20 vaccine, Flu vaccine at work, RSV. Ask patient to consider Covid booster.

## 2022-03-16 NOTE — Progress Notes (Signed)
   Subjective:    Patient ID: Terri Montes, female    DOB: 1960/11/09, 61 y.o.   MRN: [id number]  HPI 61 year old Female seen for health maintenance exam and evaluation of medical issues.  She has a history of left knee arthroplasty by Dr. Harlow Mares in [place] in April 2021.  Cesarean section 1995, knee arthroscopy in 1987, 2 pregnancies.  No miscarriages.  Total abdominal hysterectomy without oophorectomy for fibroids in 2009.  Septic tenosynovitis of left ankle secondary to injury at [place] in 2002 with fragmentation of the tendon requiring surgery.  Vasovagal syncope in 1997.  History of migraine headaches treated by Dr. Domingo Cocking in the past.  MRI of the brain in 2005 was negative.  She is allergic to Sulfa.  Imitrex causes facial numbness.  History of essential hypertension and hyperlipidemia.  History of arthroplasty left knee.  Longstanding history of dependent edema treated with diuretic.    Social history: She is married.  2 adult sons from previous marriage.  Employed at [employer].  Family history: Father passed away from complications of lung cancer.  Mother passed away from heart failure in 2022.  Patient has longstanding history of obesity.  In April 2023, annual mammogram showed asymmetry in right breast within upper inner quadrant.  Subsequently had breast biopsy in late April and was found to have a ER positive, PR positive, HER2 negative by FISH invasive ductal carcinoma.  Is being followed by Oncology.  Had right breast lumpectomy by Dr. Nedra Hai in June.  All in all is doing fairly well.  Unfortunately her weight has increased over the past year and is now 245 pounds 12.8 ounces and BMI is 43.54.  Review of Systems considerable fatigue.  Anxiety under good control with Xanax.     Objective:   Physical Exam Vital signs reviewed.  Skin: Without rashes.  No cervical adenopathy.  No thyromegaly.  No carotid bruits.  Chest clear.  Cardiac exam: Regular rate and rhythm  without ectopy.  Abdomen is soft nondistended without hepatosplenomegaly masses or tenderness.  Trace lower extremity pitting edema.  Brief neurological exam intact without gross focal deficits.  Affect thought and judgment are normal.       Assessment & Plan:  Situational stress with recent diagnosis of right breast cancer.  Currently seen by the [place] status post lumpectomy and radiation therapy.  Tumor was in upper inner quadrant of right breast.  Mammogram detected right breast asymmetry spiculated mass 0.9 cm at 1:00.  Grade 1 intraductal carcinoma.  ER 100%, PR 100% HER2 equivocal Ki-67 less than 5%.  Plans are for patient to be on anastrozole for 5 to 7 years.  She has follow-up appointment at cancer center in November.  Essential hypertension treated with olmesartan and Lasix  BMI 43.54-would like for her to take this opportunity to work on diet exercise and weight loss.  Hemoglobin A1c was normal in July.  Anxiety state treated with Xanax  Hyperlipidemia-recommend continuing Crestor 10 mg daily.  Lipids were normal in July.  Healthcare maintenance-colonoscopy is up-to-date.  Vaccines discussed.  Follow-up here in 6 months.  Will have lipid panel liver functions and office visit at that time.

## 2022-03-27 ENCOUNTER — Other Ambulatory Visit: Payer: Self-pay | Admitting: Internal Medicine

## 2022-04-11 ENCOUNTER — Ambulatory Visit
Admission: RE | Admit: 2022-04-11 | Discharge: 2022-04-11 | Disposition: A | Discharge status: Home / Self Care | Payer: Managed Care, Other (non HMO) | Source: Ambulatory Visit | Attending: Hematology and Oncology | Admitting: Hematology and Oncology

## 2022-04-11 DIAGNOSIS — Z780 Asymptomatic menopausal state: Secondary | ICD-10-CM | POA: Diagnosis present

## 2022-04-11 DIAGNOSIS — Z170 Estrogen receptor positive status [ER+]: Secondary | ICD-10-CM | POA: Diagnosis present

## 2022-04-11 DIAGNOSIS — C50211 Malignant neoplasm of upper-inner quadrant of right female breast: Secondary | ICD-10-CM | POA: Insufficient documentation

## 2022-04-26 ENCOUNTER — Inpatient Hospital Stay: Payer: Managed Care, Other (non HMO) | Attending: Adult Health | Admitting: Adult Health

## 2022-04-26 ENCOUNTER — Other Ambulatory Visit: Payer: Self-pay

## 2022-04-26 ENCOUNTER — Encounter: Payer: Self-pay | Admitting: Adult Health

## 2022-04-26 VITALS — BP 151/84 | HR 88 | Temp 97.7°F | Resp 16 | Wt 247.6 lb

## 2022-04-26 DIAGNOSIS — Z170 Estrogen receptor positive status [ER+]: Secondary | ICD-10-CM | POA: Insufficient documentation

## 2022-04-26 DIAGNOSIS — Z79811 Long term (current) use of aromatase inhibitors: Secondary | ICD-10-CM | POA: Insufficient documentation

## 2022-04-26 DIAGNOSIS — Z923 Personal history of irradiation: Secondary | ICD-10-CM | POA: Insufficient documentation

## 2022-04-26 DIAGNOSIS — C50211 Malignant neoplasm of upper-inner quadrant of right female breast: Secondary | ICD-10-CM | POA: Diagnosis not present

## 2022-04-26 NOTE — Progress Notes (Signed)
SURVIVORSHIP VISIT:  BRIEF ONCOLOGIC HISTORY:  Oncology History  Malignant neoplasm of upper-inner quadrant of right female breast ([facility])  09/22/2021 Initial Diagnosis   Screening mammogram detected right breast asymmetry spiculated mass 0.9 cm at 1 o'clock position, axilla negative, ultrasound-guided biopsy: Grade 1 IDC ER 100%, PR 100%, HER2 equivocal by IHC, FISH pending, Ki-67 less than 5%   11/02/2021 Surgery   BREAST, RIGHT, LUMPECTOMY:pT1c, pN0(sn)    11/15/2021 Oncotype testing   Oncotype DX Breast Recurrence Score: 7 (ROR 3 %)   12/18/2021 - 01/18/2022 Radiation Therapy   Adjuvant radiation to the right breast:  42.56 Gy in 16 treatments "Boost": 16 Gy in 8 treatments    01/2022 -  Anti-estrogen oral therapy   1 mg Anastrozole x 5-7 years     INTERVAL HISTORY:  Terri Montes to review her survivorship care plan detailing her treatment course for breast cancer, as well as monitoring long-term side effects of that treatment, education regarding health maintenance, screening, and overall wellness and health promotion.     Overall, Terri Montes reports feeling quite well.  She started the Anastrozole at the end of September.  She is developing increased joint aches and pains.  She notes an increase in her cholesterol compared to baseline.  She has noticed insomnia--with difficulty falling asleep.  She is concerned about weight gain as well.  She is up a few pounds ont he scale, however also feels swollen.  She has been on crestor for two years.  She noted an icrease from 140 to 208.  These changes have been very discouraging.    She says her energy level is ok, however she is struggling with her cognitive executive functioning being less sharp than previous and having some fogginess.  She has not had a sleep study before.    REVIEW OF SYSTEMS:  Review of Systems  Constitutional:  Negative for appetite change, chills, fatigue, fever and unexpected weight change.  HENT:   Negative for hearing  loss, lump/mass and trouble swallowing.   Eyes:  Negative for eye problems and icterus.  Respiratory:  Negative for chest tightness, cough and shortness of breath.   Cardiovascular:  Negative for chest pain, leg swelling and palpitations.  Gastrointestinal:  Negative for abdominal distention, abdominal pain, constipation, diarrhea, nausea and vomiting.  Endocrine: Negative for hot flashes.  Genitourinary:  Negative for difficulty urinating.   Musculoskeletal:  Negative for arthralgias.  Skin:  Negative for itching and rash.  Neurological:  Negative for dizziness, extremity weakness, headaches and numbness.  Hematological:  Negative for adenopathy. Does not bruise/bleed easily.  Psychiatric/Behavioral:  Negative for depression. The patient is not nervous/anxious.   Breast: Denies any new nodularity, masses, tenderness, nipple changes, or nipple discharge.     PAST MEDICAL/SURGICAL HISTORY:  Past Medical History:  Diagnosis Date   Allergy    seasonal   Anxiety    Depression    Hypercholesteremia    Hypertension    PONV (postoperative nausea and vomiting)    Past Surgical History:  Procedure Laterality Date   ABDOMINAL HYSTERECTOMY  03/28/2010   partial   BREAST BIOPSY Right 2002   benign   BREAST LUMPECTOMY WITH RADIOACTIVE SEED AND SENTINEL LYMPH NODE BIOPSY Right 11/02/2021   Procedure: RIGHT BREAST LUMPECTOMY WITH RADIOACTIVE SEED AND SENTINEL LYMPH NODE BIOPSY;  Surgeon: Coralie Keens, MD;  Location: [facility];  Service: General;  Laterality: Right;   BREAST SURGERY Right 05/28/2000   benign breast biopsy   CESAREAN SECTION  04/27/1994   REPAIR PERONEAL TENDONS ANKLE Bilateral 2003, 2006   REPLACEMENT TOTAL KNEE Left 2021     ALLERGIES:  Allergies  Allergen Reactions   Sulfa Antibiotics Rash and Hives     CURRENT MEDICATIONS:  Outpatient Encounter Medications as of 04/26/2022  Medication Sig   acetaminophen (TYLENOL) 325 MG tablet Take 650 mg  by mouth every 6 (six) hours as needed for pain.   ALPRAZolam (XANAX) 0.5 MG tablet Take 1 tablet (0.5 mg total) by mouth 2 (two) times daily as needed for anxiety.   anastrozole (ARIMIDEX) 1 MG tablet Take 1 tablet (1 mg total) by mouth daily.   escitalopram (LEXAPRO) 20 MG tablet TAKE 1 TABLET BY MOUTH  DAILY   furosemide (LASIX) 20 MG tablet TAKE ONE TABLET EVERY DAY (Patient taking differently: Take 20 mg by mouth as needed.)   ketoconazole (NIZORAL) 2 % cream Apply 1 application topically daily. (Patient taking differently: Apply 1 application  topically daily as needed (as needed).)   olmesartan (BENICAR) 20 MG tablet TAKE 1 TABLET BY MOUTH DAILY   rosuvastatin (CRESTOR) 10 MG tablet TAKE 1 TABLET BY MOUTH  DAILY   [DISCONTINUED] ketotifen (ZADITOR) 0.025 % ophthalmic solution Apply to eye.   No facility-administered encounter medications on file as of 04/26/2022.     ONCOLOGIC FAMILY HISTORY:  Family History  Problem Relation Age of Onset   Hypertension Mother    Lung cancer Father 81   Hypertension Brother    Skin cancer Paternal Grandfather    Colon cancer Neg Hx    Breast cancer Neg Hx      SOCIAL HISTORY:  Social History   Socioeconomic History   Marital status: Married    Spouse name: Not on file   Number of children: Not on file   Years of education: Not on file   Highest education level: Not on file  Occupational History   Not on file  Tobacco Use   Smoking status: Never   Smokeless tobacco: Never  Vaping Use   Vaping Use: Never used  Substance and Sexual Activity   Alcohol use: No    Alcohol/week: 0.0 standard drinks of alcohol   Drug use: No   Sexual activity: Not on file  Other Topics Concern   Not on file  Social History Narrative   Not on file   Social Determinants of Health   Financial Resource Strain: Not on file  Food Insecurity: Not on file  Transportation Needs: Not on file  Physical Activity: Not on file  Stress: Not on file  Social  Connections: Not on file  Intimate Partner Violence: Not on file     OBSERVATIONS/OBJECTIVE:  BP (!) 151/84 (BP Location: Left Arm, Patient Position: Sitting)   Pulse 88   Temp 97.7 F (36.5 C) (Temporal)   Resp 16   Wt 247 lb 9 oz (112.3 kg)   SpO2 94%   BMI 43.85 kg/m  GENERAL: Patient is a well appearing female in no acute distress HEENT:  Sclerae anicteric.  Oropharynx clear and moist. No ulcerations or evidence of oropharyngeal candidiasis. Neck is supple.  NODES:  No cervical, supraclavicular, or axillary lymphadenopathy palpated.  BREAST EXAM: Right breast status postlumpectomy and radiation no sign of local recurrence left breast is benign LUNGS:  Clear to auscultation bilaterally.  No wheezes or rhonchi. HEART:  Regular rate and rhythm. No murmur appreciated. ABDOMEN:  Soft, nontender.  Positive, normoactive bowel sounds. No organomegaly palpated. MSK:  No focal spinal  tenderness to palpation. Full range of motion bilaterally in the upper extremities. EXTREMITIES:  No peripheral edema.   SKIN:  Clear with no obvious rashes or skin changes. No nail dyscrasia. NEURO:  Nonfocal. Well oriented.  Appropriate affect.  LABORATORY DATA:  None for this visit.  DIAGNOSTIC IMAGING:  None for this visit.      ASSESSMENT AND PLAN:  Ms.. Montes is a pleasant 61 y.o. female with Stage IA right breast invasive ductal carcinoma, ER+/PR+/HER2-, diagnosed in April 2023, treated with lumpectomy, adjuvant radiation therapy, and anti-estrogen therapy with anastrozole beginning in timbre 2023.  She presents to the Survivorship Clinic for our initial meeting and routine follow-up post-completion of treatment for breast cancer.    1. Stage 1A right breast cancer:  Terri Montes is continuing to recover from definitive treatment for breast cancer.  She is experiencing increased side effects from the anastrozole and is unsure that the side effects are worth the risk aversion of recurrence.  For this  reason I recommended that she take a holiday off of the anastrozole and continue to recover from her initial cancer treatment and we will see her back in 4 weeks to discuss next steps.  Today, a comprehensive survivorship care plan and treatment summary was reviewed with the patient today detailing her breast cancer diagnosis, treatment course, potential late/long-term effects of treatment, appropriate follow-up care with recommendations for the future, and patient education resources.  A copy of this summary, along with a letter will be sent to the patient's primary care provider via mail/fax/In Basket message after today's visit.    2. Bone health:  Given Terri Montes's age/history of breast cancer and her current treatment regimen including anti-estrogen therapy with anastrozole, she is at risk for bone demineralization.  She underwent bone density testing on April 11, 2022 that was normal.  She was given education on specific activities to promote bone health.  3. Cancer screening:  Due to Terri Montes's history and her age, she should receive screening for skin cancers, colon cancer, and gynecologic cancers.  The information and recommendations are listed on the patient's comprehensive care plan/treatment summary and were reviewed in detail with the patient.    4. Health maintenance and wellness promotion: Terri Montes was encouraged to consume 5-7 servings of fruits and vegetables per day. We reviewed the "Nutrition Rainbow" handout.  She was also encouraged to engage in moderate to vigorous exercise for 30 minutes per day most days of the week. We discussed the [employer] fitness program, which is designed for cancer survivors to help them become more physically fit after cancer treatments.  She was instructed to limit her alcohol consumption and continue to abstain from tobacco use.     5. Support services/counseling: It is not uncommon for this period of the patient's cancer care trajectory to be  one of many emotions and stressors.  She was given information regarding our available services and encouraged to contact me with any questions or for help enrolling in any of our support group/programs.    Follow up instructions:    -Return to cancer center in 4 weeks for follow-up -Mammogram due in April 2024 -She is welcome to return back to the Survivorship Clinic at any time; no additional follow-up needed at this time.  -Consider referral back to survivorship as a long-term survivor for continued surveillance  The patient was provided an opportunity to ask questions and all were answered. The patient agreed with the plan and demonstrated an understanding  of the instructions.   Total encounter time:40 minutes*in face-to-face visit time, chart review, lab review, care coordination, order entry, and documentation of the encounter time.    Wilber Bihari, NP 04/27/22 4:56 PM Medical Oncology and Hematology [facility] [place],  [address] Tel. [phone]    Fax. [phone]  *Total Encounter Time as defined by the Centers for Medicare and Medicaid Services includes, in addition to the face-to-face time of a patient visit (documented in the note above) non-face-to-face time: obtaining and reviewing outside history, ordering and reviewing medications, tests or procedures, care coordination (communications with other health care professionals or caregivers) and documentation in the medical record.

## 2022-04-30 ENCOUNTER — Other Ambulatory Visit: Payer: Self-pay | Admitting: Internal Medicine

## 2022-05-25 ENCOUNTER — Ambulatory Visit (INDEPENDENT_AMBULATORY_CARE_PROVIDER_SITE_OTHER): Payer: Managed Care, Other (non HMO) | Admitting: Internal Medicine

## 2022-05-25 ENCOUNTER — Telehealth: Payer: Self-pay | Admitting: Internal Medicine

## 2022-05-25 ENCOUNTER — Telehealth: Payer: Managed Care, Other (non HMO)

## 2022-05-25 ENCOUNTER — Encounter: Payer: Self-pay | Admitting: Internal Medicine

## 2022-05-25 VITALS — BP 114/82 | HR 82 | Temp 100.0°F

## 2022-05-25 DIAGNOSIS — R509 Fever, unspecified: Secondary | ICD-10-CM | POA: Diagnosis not present

## 2022-05-25 DIAGNOSIS — J029 Acute pharyngitis, unspecified: Secondary | ICD-10-CM

## 2022-05-25 DIAGNOSIS — R059 Cough, unspecified: Secondary | ICD-10-CM | POA: Diagnosis not present

## 2022-05-25 LAB — POCT RAPID STREP A (OFFICE): Rapid Strep A Screen: NEGATIVE

## 2022-05-25 LAB — POCT INFLUENZA A/B
Influenza A, POC: NEGATIVE
Influenza B, POC: NEGATIVE

## 2022-05-25 MED ORDER — HYDROCODONE BIT-HOMATROP MBR 5-1.5 MG/5ML PO SOLN: 5.0000 mL | Freq: Three times a day (TID) | ORAL | 0 refills | Status: DC | PRN

## 2022-05-25 MED ORDER — FLUCONAZOLE 150 MG PO TABS: 150.0000 mg | ORAL_TABLET | Freq: Once | ORAL | 0 refills | Status: AC

## 2022-05-25 MED ORDER — AZITHROMYCIN 250 MG PO TABS: ORAL_TABLET | ORAL | 0 refills | Status: AC

## 2022-05-25 NOTE — Patient Instructions (Addendum)
Zithromax Z-PAK 2 tabs day 1 followed by 1 tab days 2 through 5.  Hycodan 1 teaspoon every 6-8 hours as needed for cough.  May take Diflucan if develop Candida vaginitis while on antibiotics.  Respiratory virus panel is pending.  Quarantine at home until feeling better.  Try to stay well-hydrated.  Strep screen and flu tests are negative.

## 2022-05-25 NOTE — Telephone Encounter (Signed)
Terri Montes [phone]  Terri Montes called to say she has been running fever for 2 days it has been as high as 102.4, really bad sore throat, runny nose, drainage, green mucus, she did COVID test on Wednesday that was negative, I have ask her to do another one and call me back.

## 2022-05-25 NOTE — Progress Notes (Signed)
   Subjective:    Patient ID: Terri Montes, female    DOB: [birth year], 62 y.o.   MRN: [id number]  HPI  Patient seen today with acute illness. She has had fever 101.8 degrees, cough congestion, malaise and fatigue. Husband is also ill. She has taken 2 Covid tests and both are negative. Had family over for a holiday meal recently. Son has been ill also. No vomiting. Says temp has been as high as 102.4 degrees. Has discolored nasal drainage  A rapid Covid test was not done here today but Rapid flu test was negative and Respiratory virus panel was sent to [place] [facility]. Rapid strep test was negative as well.  Review of Systems She has discomfort in her right chest and was diagnosed with  right breast cancer in the Spring of 2023.She is under the care of Dr. Lindi Adie. She is worried about possible pneumonia.     Objective:   Physical Exam    T 100 degrees, pulse 82 and regular, BP 114/82, pulse ox 96% on room air  She has coryza. She looks fatigued. Pharynx is red without exudate. Neck is supple. Chest is clear without rales or wheezing. She is slightly pale. Not tachypneic.     Assessment & Plan:     Viral syndrome- affecting respiratory tract- etiology unclear thus far. Respiratory virus panel is pending    She is at risk for secondary bacterial infection. Respiratory virus panel sent to [facility]. Will treat with Zithromax Z-pak : 2 tabs day 1 followed by one tab days 2-5. Also, take Hycodan one tsp every 6-8 hours by mouth as needed for cough and sore throat pain. Stay well hydrated. Tylenol as needed for fever. Quarantine at home until much better i.e. 3-4 days minimum. Diflucan sent also for Candida vaginitis if needed with antibiotic therapy.  History of breast cancer  Essential hypertension-stable on treatment  History of anxiety  Hyperlipidemia-treated with statin  History of anxiety treated with Xanax  Plan: Zithromax Z-PAK 2 tabs day 1 followed by 1 tab days 2 through 5.  Hycodan as  needed for cough and sore throat pain.  Tylenol as needed for fever.  Respiratory virus panel is pending.  Quarantine at home until feeling better.  Diflucan sent for possible Candida vaginitis while on antibiotic therapy.

## 2022-05-25 NOTE — Telephone Encounter (Signed)
scheduled

## 2022-05-27 LAB — RESPIRATORY VIRUS PANEL
Adenovirus B: NOT DETECTED
HUMAN PARAINFLU VIRUS 1: NOT DETECTED
HUMAN PARAINFLU VIRUS 2: NOT DETECTED
HUMAN PARAINFLU VIRUS 3: NOT DETECTED
INFLUENZA A SUBTYPE H1: DETECTED — AB
INFLUENZA A SUBTYPE H3: NOT DETECTED
Influenza A: DETECTED — AB
Influenza B: NOT DETECTED
Metapneumovirus: NOT DETECTED
Respiratory Syncytial Virus A: NOT DETECTED
Respiratory Syncytial Virus B: NOT DETECTED
Rhinovirus: NOT DETECTED

## 2022-05-28 NOTE — Telephone Encounter (Signed)
Called pt to let her know her Respiratory virus panel was positive for Influenza A. She is on Z-pak and has developed productive cough. Likely too late to start Tamiflu. Able to hydrate. Z-pak should help with discolored sputum. Benadryl is helping her get some sleep at night. Able to hydrate. No vomiting but has decreased energy.MJB, MD

## 2022-05-31 NOTE — Progress Notes (Incomplete)
Patient Care Team: Elby Showers, MD as PCP - General (Internal Medicine) Coralie Keens, MD as Consulting Physician (General Surgery) Nicholas Lose, MD as Consulting Physician (Hematology and Oncology) Kyung Rudd, MD as Consulting Physician (Radiation Oncology) Noreene Filbert, MD as Consulting Physician (Radiation Oncology)  DIAGNOSIS: No diagnosis found.  SUMMARY OF ONCOLOGIC HISTORY: Oncology History  Malignant neoplasm of upper-inner quadrant of right female breast ([place])  09/22/2021 Initial Diagnosis   Screening mammogram detected right breast asymmetry spiculated mass 0.9 cm at 1 o'clock position, axilla negative, ultrasound-guided biopsy: Grade 1 IDC ER 100%, PR 100%, HER2 equivocal by IHC, FISH pending, Ki-67 less than 5%   11/02/2021 Surgery   BREAST, RIGHT, LUMPECTOMY:pT1c, pN0(sn)    11/15/2021 Oncotype testing   Oncotype DX Breast Recurrence Score: 7 (ROR 3 %)   12/18/2021 - 01/18/2022 Radiation Therapy   Adjuvant radiation to the right breast:  42.56 Gy in 16 treatments "Boost": 16 Gy in 8 treatments    01/2022 -  Anti-estrogen oral therapy   1 mg Anastrozole x 5-7 years     CHIEF COMPLIANT: Follow-up on anastrozole  INTERVAL HISTORY: Terri Montes is a 62 y.o with right lumpectomy breast cancer. Terri Montes presents to the clinic today for a follow-up on anastrozole.   ALLERGIES:  is allergic to sulfa antibiotics.  MEDICATIONS:  Current Outpatient Medications  Medication Sig Dispense Refill   acetaminophen (TYLENOL) 325 MG tablet Take 650 mg by mouth every 6 (six) hours as needed for pain.     ALPRAZolam (XANAX) 0.5 MG tablet Take 1 tablet (0.5 mg total) by mouth 2 (two) times daily as needed for anxiety. 60 tablet 0   escitalopram (LEXAPRO) 20 MG tablet TAKE 1 TABLET BY MOUTH  DAILY 90 tablet 3   furosemide (LASIX) 20 MG tablet TAKE ONE TABLET EVERY DAY 90 tablet 1   HYDROcodone bit-homatropine (HYCODAN) 5-1.5 MG/5ML syrup Take 5 mLs by mouth every 8 (eight)  hours as needed for cough. 120 mL 0   ketoconazole (NIZORAL) 2 % cream Apply 1 application topically daily. (Patient taking differently: Apply 1 application  topically daily as needed (as needed).) 60 g 2   olmesartan (BENICAR) 20 MG tablet TAKE 1 TABLET BY MOUTH DAILY 90 tablet 3   rosuvastatin (CRESTOR) 10 MG tablet TAKE 1 TABLET BY MOUTH  DAILY 90 tablet 3   No current facility-administered medications for this visit.    PHYSICAL EXAMINATION: ECOG PERFORMANCE STATUS: {CHL ONC ECOG PS:[id number]}  There were no vitals filed for this visit. There were no vitals filed for this visit.  BREAST:*** No palpable masses or nodules in either right or left breasts. No palpable axillary supraclavicular or infraclavicular adenopathy no breast tenderness or nipple discharge. (exam performed in the presence of a chaperone)  LABORATORY DATA:  I have reviewed the data as listed    Latest Ref Rng & Units 12/13/2021   12:00 AM 11/01/2021   11:39 AM 10/04/2021   12:18 PM  CMP  Glucose 70 - 99 mg/dL  94  97   BUN 6 - 20 mg/dL  9  14   Creatinine 0.5 - 1.1 1.0     1.05  1.04   Sodium 135 - 145 mmol/L  142  143   Potassium 3.5 - 5.1 mmol/L  4.1  3.7   Chloride 98 - 111 mmol/L  108  106   CO2 22 - 32 mmol/L  28  32   Calcium 8.9 - 10.3 mg/dL  9.5  9.5   Total Protein 6.5 - 8.1 g/dL   7.7   Total Bilirubin 0.3 - 1.2 mg/dL   0.5   Alkaline Phos 38 - 126 U/L   87   AST 15 - 41 U/L   17   ALT 0 - 44 U/L   13      This result is from an external source.    Lab Results  Component Value Date   WBC 4.7 03/14/2022   HGB 13.9 03/14/2022   HCT 42 03/14/2022   MCV 93.0 01/10/2022   PLT 294 03/14/2022   NEUTROABS 64.00 03/14/2022    ASSESSMENT & PLAN:  No problem-specific Assessment & Plan notes found for this encounter.    No orders of the defined types were placed in this encounter.  The patient has a good understanding of the overall plan. Terri Montes agrees with it. Terri Montes will call with any problems  that may develop before the next visit here. Total time spent: 30 mins including face to face time and time spent for planning, charting and co-ordination of care   Suzzette Righter, [place] 05/31/22    I Gardiner Coins am acting as a Education administrator for [employer]  ***

## 2022-06-06 ENCOUNTER — Other Ambulatory Visit: Payer: Self-pay | Admitting: Internal Medicine

## 2022-06-07 ENCOUNTER — Ambulatory Visit: Payer: Managed Care, Other (non HMO) | Admitting: Hematology and Oncology

## 2022-08-09 ENCOUNTER — Other Ambulatory Visit: Payer: Self-pay | Admitting: Internal Medicine

## 2022-09-03 ENCOUNTER — Ambulatory Visit
Admission: RE | Admit: 2022-09-03 | Discharge: 2022-09-03 | Disposition: A | Discharge status: Home / Self Care | Payer: Managed Care, Other (non HMO) | Source: Ambulatory Visit | Attending: Adult Health | Admitting: Adult Health

## 2022-09-03 DIAGNOSIS — C50211 Malignant neoplasm of upper-inner quadrant of right female breast: Secondary | ICD-10-CM

## 2022-09-03 DIAGNOSIS — Z170 Estrogen receptor positive status [ER+]: Secondary | ICD-10-CM

## 2022-09-03 HISTORY — DX: Personal history of irradiation: Z92.3

## 2022-09-07 NOTE — Progress Notes (Shared)
Patient Care Team: Margaree Mackintosh, MD as PCP - General (Internal Medicine) Abigail Miyamoto, MD as Consulting Physician (General Surgery) Serena Croissant, MD as Consulting Physician (Hematology and Oncology) Dorothy Puffer, MD as Consulting Physician (Radiation Oncology) Carmina Miller, MD as Consulting Physician (Radiation Oncology)  Visit Date: 09/07/22  Subjective:    Patient ID: Terri Montes , Female   DOB: 12-31-1960, 62 y.o.    MRN: [id number]   62 y.o. Female presents today for a 6 month follow-up. Patient has a past medical history of allergies, anxiety and depression, hypercholesterolemia, hypertension.  History of hyperlipidemia treated with resuvastatin 10 mg daily.  History of hypertension treated with olmesartan 20 mg daily.  History of anxiety and depression treated with alprazolam 0.5 mg twice daily as needed, escitalopram 20 mg daily.  History of lower extremity edema treated with furosemide 20 mg daily.     Past Medical History:  Diagnosis Date   Allergy    seasonal   Anxiety    Depression    Hypercholesteremia    Hypertension    Personal history of radiation therapy    PONV (postoperative nausea and vomiting)      Family History  Problem Relation Age of Onset   Hypertension Mother    Lung cancer Father 68   Hypertension Brother    Skin cancer Paternal Grandfather    Colon cancer Neg Hx    Breast cancer Neg Hx     Social History   Social History Narrative   Not on file      ROS      Objective:   Vitals: There were no vitals taken for this visit.   Physical Exam    Results:   Studies obtained and personally reviewed by me:  Imaging, colonoscopy, mammogram, bone density scan, echocardiogram, heart cath, stress test, CT calcium score, etc. ***   Labs:       Component Value Date/Time   NA 142 11/01/2021 1139   NA 143 03/03/2020 0827   K 4.1 11/01/2021 1139   CL 108 11/01/2021 1139   CO2 28 11/01/2021 1139   GLUCOSE 94  11/01/2021 1139   BUN 9 11/01/2021 1139   BUN 18 03/03/2020 0827   CREATININE 1.0 12/13/2021 0000   CREATININE 1.05 (H) 11/01/2021 1139   CREATININE 1.04 (H) 10/04/2021 1218   CREATININE 0.90 05/07/2016 1144   CALCIUM 9.5 11/01/2021 1139   PROT 7.7 10/04/2021 1218   PROT 7.3 03/03/2020 0827   ALBUMIN 4.3 10/04/2021 1218   ALBUMIN 4.6 03/03/2020 0827   AST 17 10/04/2021 1218   ALT 13 10/04/2021 1218   ALKPHOS 87 10/04/2021 1218   BILITOT 0.5 10/04/2021 1218   GFRNONAA >60 11/01/2021 1139   GFRNONAA >60 10/04/2021 1218   GFRNONAA 72 05/07/2016 1144   GFRAA 77 03/03/2020 0827   GFRAA 83 05/07/2016 1144     Lab Results  Component Value Date   WBC 4.7 03/14/2022   HGB 13.9 03/14/2022   HCT 42 03/14/2022   MCV 93.0 01/10/2022   PLT 294 03/14/2022    Lab Results  Component Value Date   CHOL 208 (A) 03/15/2022   HDL 44 03/15/2022   LDLCALC 125 03/15/2022   TRIG 219 (A) 03/15/2022   CHOLHDL 3.3 03/03/2020    Lab Results  Component Value Date   HGBA1C 5.6 03/15/2022     Lab Results  Component Value Date   TSH 1.12 03/15/2022     No results found for: "  PSA1", "PSA" *** delete for female pts  ***    Assessment & Plan:   ***    I,Alexander Ruley,acting as a scribe for Margaree Mackintosh, MD.,have documented all relevant documentation on the behalf of Margaree Mackintosh, MD,as directed by  Margaree Mackintosh, MD while in the presence of Margaree Mackintosh, MD.   ***

## 2022-09-12 ENCOUNTER — Telehealth: Payer: Self-pay

## 2022-09-12 ENCOUNTER — Other Ambulatory Visit: Payer: Self-pay

## 2022-09-12 DIAGNOSIS — E782 Mixed hyperlipidemia: Secondary | ICD-10-CM

## 2022-09-12 NOTE — Telephone Encounter (Signed)
Patient stated she has not been taking her rosuvastatin (CRESTOR) 10 MG tablet and will start back taking them.  She will also call the office to schedule an office visit after she has been taking a while.

## 2022-09-13 NOTE — Telephone Encounter (Signed)
Patient was called back and has scheduled an appt lab orders were placed and mailed to patient

## 2022-09-14 ENCOUNTER — Ambulatory Visit: Payer: Managed Care, Other (non HMO) | Admitting: Internal Medicine

## 2022-09-24 ENCOUNTER — Ambulatory Visit: Payer: Managed Care, Other (non HMO) | Admitting: Radiation Oncology

## 2022-11-05 NOTE — Progress Notes (Signed)
Patient Care Team: Margaree Mackintosh, MD as PCP - General (Internal Medicine) Abigail Miyamoto, MD as Consulting Physician (General Surgery) Serena Croissant, MD as Consulting Physician (Hematology and Oncology) Dorothy Puffer, MD as Consulting Physician (Radiation Oncology) Carmina Miller, MD as Consulting Physician (Radiation Oncology)  Visit Date: 11/12/22  Subjective:    Patient ID: Terri Montes , Female   DOB: 06/24/1960, 62 y.o.    MRN: [id number]   62 y.o. Female presents today to discuss lab results, receive Tdap update.  History of hyperlipidemia treated with rosuvastatin 10 mg daily. TRIG elevated at 183 on 11/08/22, down from 219 eight months ago. LDL at 53, down from 125. Family history of heart disease in mother. Plays pickle ball three times weekly. Believes the problem is soda.  Liver functions normal.   Past Medical History:  Diagnosis Date   Allergy    seasonal   Anxiety    Depression    Hypercholesteremia    Hypertension    Personal history of radiation therapy    PONV (postoperative nausea and vomiting)      Family History  Problem Relation Age of Onset   Hypertension Mother    Lung cancer Father 69   Hypertension Brother    Skin cancer Paternal Grandfather    Colon cancer Neg Hx    Breast cancer Neg Hx     Social History   Social History Narrative   Not on file      Review of Systems  Constitutional:  Negative for fever and malaise/fatigue.  HENT:  Negative for congestion.   Eyes:  Negative for blurred vision.  Respiratory:  Negative for cough and shortness of breath.   Cardiovascular:  Negative for chest pain, palpitations and leg swelling.  Gastrointestinal:  Negative for vomiting.  Musculoskeletal:  Negative for back pain.  Skin:  Negative for rash.  Neurological:  Negative for loss of consciousness and headaches.        Objective:   Vitals: BP 126/88   Pulse 76   Temp (!) 97.4 F (36.3 C) (Tympanic)   Resp 16   Ht 5\' 3"  (1.6  m)   Wt 249 lb 8 oz (113.2 kg)   SpO2 97%   BMI 44.20 kg/m    Physical Exam Vitals and nursing note reviewed.  Constitutional:      General: She is not in acute distress.    Appearance: Normal appearance. She is not toxic-appearing.  HENT:     Head: Normocephalic and atraumatic.  Pulmonary:     Effort: Pulmonary effort is normal.  Skin:    General: Skin is warm and dry.  Neurological:     Mental Status: She is alert and oriented to person, place, and time. Mental status is at baseline.  Psychiatric:        Mood and Affect: Mood normal.        Behavior: Behavior normal.        Thought Content: Thought content normal.        Judgment: Judgment normal.       Results:   Studies obtained and personally reviewed by me:   Labs:       Component Value Date/Time   NA 142 11/01/2021 1139   NA 143 03/03/2020 0827   K 4.1 11/01/2021 1139   CL 108 11/01/2021 1139   CO2 28 11/01/2021 1139   GLUCOSE 94 11/01/2021 1139   BUN 9 11/01/2021 1139   BUN 18 03/03/2020 0827  CREATININE 1.0 12/13/2021 0000   CREATININE 1.05 (H) 11/01/2021 1139   CREATININE 1.04 (H) 10/04/2021 1218   CREATININE 0.90 05/07/2016 1144   CALCIUM 9.5 11/01/2021 1139   PROT 7.7 10/04/2021 1218   PROT 7.3 03/03/2020 0827   ALBUMIN 4.2 11/08/2022 0000   ALBUMIN 4.6 03/03/2020 0827   AST 18 11/08/2022 0000   AST 17 10/04/2021 1218   ALT 19 11/08/2022 0000   ALT 13 10/04/2021 1218   ALKPHOS 97 11/08/2022 0000   BILITOT 0.5 10/04/2021 1218   GFRNONAA >60 11/01/2021 1139   GFRNONAA >60 10/04/2021 1218   GFRNONAA 72 05/07/2016 1144   GFRAA 77 03/03/2020 0827   GFRAA 83 05/07/2016 1144     Lab Results  Component Value Date   WBC 4.7 03/14/2022   HGB 13.9 03/14/2022   HCT 42 03/14/2022   MCV 93.0 01/10/2022   PLT 294 03/14/2022    Lab Results  Component Value Date   CHOL 125 11/08/2022   HDL 42 11/08/2022   LDLCALC 53 11/08/2022   TRIG 183 (A) 11/08/2022   CHOLHDL 3.3 03/03/2020    Lab  Results  Component Value Date   HGBA1C 5.6 03/15/2022     Lab Results  Component Value Date   TSH 1.12 03/15/2022      Assessment & Plan:   Hyperlipidemia: treated with rosuvastatin 10 mg daily. TRIG elevated at 183 on 11/08/22, down from 219 eight months ago. LDL at 53, down from 125. Ordered CT coronary calcium score. Will wait for results before increasing statin.  Also coronary calcium score ordered due to family history of heart disease in her mother.  She currently is on rosuvastatin 10 mg daily.  She does not think she will tolerate increased dose due to myalgias.  May need Cardiology referral.  Tetanus vaccine booster administered.  Repeat in 10 years  BMI 44.20.  She is playing pickle ball several days a week and enjoys that for exercise.  She is considering [facility].    I,Alexander Ruley,acting as a Neurosurgeon for Margaree Mackintosh, MD.,have documented all relevant documentation on the behalf of Margaree Mackintosh, MD,as directed by  Margaree Mackintosh, MD while in the presence of Margaree Mackintosh, MD.   I, Margaree Mackintosh, MD, have reviewed all documentation for this visit. The documentation on 11/12/22 for the exam, diagnosis, procedures, and orders are all accurate and complete.

## 2022-11-08 LAB — HEPATIC FUNCTION PANEL
ALT: 19 U/L (ref 7–35)
AST: 18 (ref 13–35)
Alkaline Phosphatase: 97 (ref 25–125)
Bilirubin, Direct: 0.12
Bilirubin, Total: 0.5

## 2022-11-08 LAB — LIPID PANEL
Cholesterol: 125 (ref 0–200)
HDL: 42 (ref 35–70)
LDL Cholesterol: 53
LDl/HDL Ratio: 3
Triglycerides: 183 — AB (ref 40–160)

## 2022-11-08 LAB — COMPREHENSIVE METABOLIC PANEL: Albumin: 4.2 (ref 3.5–5.0)

## 2022-11-09 ENCOUNTER — Other Ambulatory Visit: Payer: Self-pay | Admitting: Internal Medicine

## 2022-11-12 ENCOUNTER — Ambulatory Visit: Payer: Managed Care, Other (non HMO) | Admitting: Internal Medicine

## 2022-11-12 ENCOUNTER — Encounter: Payer: Self-pay | Admitting: Internal Medicine

## 2022-11-12 VITALS — BP 126/88 | HR 76 | Temp 97.4°F | Resp 16 | Ht 63.0 in | Wt 249.5 lb

## 2022-11-12 DIAGNOSIS — E782 Mixed hyperlipidemia: Secondary | ICD-10-CM

## 2022-11-12 DIAGNOSIS — Z6841 Body Mass Index (BMI) 40.0 and over, adult: Secondary | ICD-10-CM | POA: Diagnosis not present

## 2022-11-12 DIAGNOSIS — Z23 Encounter for immunization: Secondary | ICD-10-CM | POA: Diagnosis not present

## 2022-11-12 DIAGNOSIS — Z8249 Family history of ischemic heart disease and other diseases of the circulatory system: Secondary | ICD-10-CM

## 2022-11-12 NOTE — Patient Instructions (Signed)
Coronary calcium scoring ordered.  She is afraid she will not be able to tolerate increased dose of rosuvastatin due to myalgias.  We will await results of coronary calcium scoring before deciding this.  She may be a candidate to see cardiology/lipid clinic.  Tetanus vaccine update given today.  She is considering attending [facility].

## 2022-11-15 ENCOUNTER — Ambulatory Visit
Admission: RE | Admit: 2022-11-15 | Discharge: 2022-11-15 | Disposition: A | Discharge status: Home / Self Care | Payer: Self-pay | Source: Ambulatory Visit | Attending: Internal Medicine | Admitting: Internal Medicine

## 2022-11-15 DIAGNOSIS — Z8249 Family history of ischemic heart disease and other diseases of the circulatory system: Secondary | ICD-10-CM | POA: Insufficient documentation

## 2022-12-11 ENCOUNTER — Telehealth: Payer: Self-pay | Admitting: Internal Medicine

## 2022-12-11 ENCOUNTER — Encounter: Payer: Self-pay | Admitting: Internal Medicine

## 2022-12-11 NOTE — Telephone Encounter (Signed)
Called patient back to let her know that Reginal Lutes is a weight loss option, however she does not prescribe it, she would need to got to a weight loss center or endo. She stated she would check out those options.

## 2022-12-11 NOTE — Telephone Encounter (Signed)
 Called patient and let her know, she verbalized understanding

## 2023-03-12 ENCOUNTER — Other Ambulatory Visit: Payer: Self-pay | Admitting: Internal Medicine

## 2023-06-06 ENCOUNTER — Encounter: Payer: Self-pay | Admitting: Internal Medicine

## 2023-06-06 DIAGNOSIS — E782 Mixed hyperlipidemia: Secondary | ICD-10-CM

## 2023-06-06 DIAGNOSIS — F439 Reaction to severe stress, unspecified: Secondary | ICD-10-CM

## 2023-06-06 DIAGNOSIS — Z0000 Encounter for general adult medical examination without abnormal findings: Secondary | ICD-10-CM

## 2023-06-06 DIAGNOSIS — Z8249 Family history of ischemic heart disease and other diseases of the circulatory system: Secondary | ICD-10-CM

## 2023-06-06 DIAGNOSIS — Z1329 Encounter for screening for other suspected endocrine disorder: Secondary | ICD-10-CM

## 2023-06-06 DIAGNOSIS — I10 Essential (primary) hypertension: Secondary | ICD-10-CM

## 2023-06-06 DIAGNOSIS — E781 Pure hyperglyceridemia: Secondary | ICD-10-CM

## 2023-06-17 ENCOUNTER — Ambulatory Visit: Payer: Managed Care, Other (non HMO) | Admitting: Internal Medicine

## 2023-06-17 ENCOUNTER — Encounter: Payer: Self-pay | Admitting: Internal Medicine

## 2023-06-17 VITALS — BP 130/80 | HR 94 | Temp 98.3°F | Ht 63.0 in

## 2023-06-17 DIAGNOSIS — R059 Cough, unspecified: Secondary | ICD-10-CM | POA: Diagnosis not present

## 2023-06-17 DIAGNOSIS — J069 Acute upper respiratory infection, unspecified: Secondary | ICD-10-CM

## 2023-06-17 DIAGNOSIS — Z6841 Body Mass Index (BMI) 40.0 and over, adult: Secondary | ICD-10-CM

## 2023-06-17 DIAGNOSIS — R0989 Other specified symptoms and signs involving the circulatory and respiratory systems: Secondary | ICD-10-CM | POA: Diagnosis not present

## 2023-06-17 LAB — POCT FLU A/B STATUS
Influenza A, POC: NEGATIVE
Influenza B, POC: NEGATIVE

## 2023-06-17 LAB — POC COVID19 BINAXNOW: SARS Coronavirus 2 Ag: NEGATIVE

## 2023-06-17 MED ORDER — BENZONATATE 100 MG PO CAPS: 100.0000 mg | ORAL_CAPSULE | Freq: Three times a day (TID) | ORAL | 0 refills | Status: DC | PRN

## 2023-06-17 MED ORDER — AZITHROMYCIN 250 MG PO TABS
ORAL_TABLET | ORAL | 0 refills | Status: AC
Start: 2023-06-17 — End: 2023-06-22

## 2023-06-17 MED ORDER — FLUCONAZOLE 150 MG PO TABS: 150.0000 mg | ORAL_TABLET | Freq: Once | ORAL | 0 refills | Status: AC

## 2023-06-17 NOTE — Progress Notes (Shared)
Annual Wellness Visit   Patient Care Team: Baxley, Luanna Cole, MD as PCP - General (Internal Medicine) Abigail Miyamoto, MD as Consulting Physician (General Surgery) Serena Croissant, MD as Consulting Physician (Hematology and Oncology) Dorothy Puffer, MD as Consulting Physician (Radiation Oncology) Carmina Miller, MD as Consulting Physician (Radiation Oncology)  Visit Date: 06/17/23   No chief complaint on file.  Subjective:  Patient: Terri Montes, Female DOB: July 26, 1960, 64 y.o. MRN: [id number]  Terri Montes is a 63 y.o. Female who presents today for her Annual Wellness Visit. Patient has history of Essential Hypertension, Dependent Edema, Hyperlipidemia, Anxiety, and Obesity.  History of Essential Hypertension treated with 20 mg Olmesartan daily.   history of Dependent Edema treated with 20 mg Lasix daily.   History of Hyperlipidemia treated with 10 mg Rosuvastatin daily.    History of Anxiety treated with 20 mg Lexapro daily and 0.5 mg Xanax as needed, up to twice daily.    History of Obesity with most recent weight   Labs 06/07/23 - awaiting results CBC: *** CMP: *** Lipid Panel: *** TSH: ***  Colonoscopy 05/12/15 with Internal Hemorrhoids and Anal Skin Tags on retroflexed views; Mild diverticulosis noted in the sigmoid Colon; Otherwise normal with repeat recommendation of 2026.  PAP Smear on 10/31/11 was normal. Discontinued due to hysterectomy.   Mammogram on 09/03/22 with no evidence of malignancy within either breast; Expected postsurgical changes of the RIGHT breast, and a repeat recommendation of 2025.   Bone Density on 04/11/22 with T-score -0.1.   Vaccine Counseling: Due for PNA, Covid-19, Shingles; UTD on Flu and Tdap.  Past Medical History:  Diagnosis Date   Allergy    seasonal   Anxiety    Depression    Hypercholesteremia    Hypertension    Personal history of radiation therapy    PONV (postoperative nausea and vomiting)   Medical/Surgical History  Narrative:   2023 - In April, annual mammogram showed asymmetry in right breast within upper inner quadrant; subsequently had breast biopsy in late April and was found to have a ER positive, PR positive, HER2 negative by FISH invasive Ductal Carcinoma. Is being followed by Oncology. Right Breast Lumpectomy by Dr. Carman Ching in June.    2021 - Left Knee Arthroplasty by Dr. Odis Luster in [place] in April.  2009 - Total Abdominal Hysterectomy w/o Oophorectomy for fibroids.  2005 -  History of Migraine Headaches treated by Dr. Neale Burly in the past, with Imitrex noted to cause facial numbness; MRI of the brain in 2005 was negative.      2002 - Septic Tenosynovitis of Left Ankle Secondary to Injury at [employer] with fragmentation of the tendon requiring surgery.    1997 - Vasovagal Syncope  1995 - Cesarean Section   1987 -  Knee Arthroscopy   Other - 2 pregnancies, no miscarriages. Allergic to Sulfa Abx. History of Arthroplasty Left Knee. Family History  Problem Relation Age of Onset   Hypertension Mother    Lung cancer Father 59   Hypertension Brother    Skin cancer Paternal Grandfather    Colon cancer Neg Hx    Breast cancer Neg Hx   Family History Narrative:  Father passed away from complications of Lung Cancer.  Mother passed away from Heart Failure in 2022.   Social History   Social History Narrative   She is married.  2 adult sons from previous marriage.  Employed at [employer].   ROS  Objective:  Vitals: There were no vitals  taken for this visit. Physical Exam Most recent functional status assessment:     No data to display         Most recent fall risk assessment:    11/12/2022   11:32 AM  Fall Risk   Falls in the past year? 0  Number falls in past yr: 0  Injury with Fall? 0  Risk for fall due to : No Fall Risks    Most recent depression screenings:    05/25/2022   11:45 AM 03/16/2022    3:15 PM  PHQ 2/9 Scores  PHQ - 2 Score 0 0   Most recent cognitive  screening:     No data to display         Results:  Studies obtained and personally reviewed by me:  *** Imaging, colonoscopy, mammogram, bone density scan, echocardiogram, heart cath, stress test, CT calcium score, etc.   Labs:     Component Value Date/Time   NA 142 11/01/2021 1139   NA 143 03/03/2020 0827   K 4.1 11/01/2021 1139   CL 108 11/01/2021 1139   CO2 28 11/01/2021 1139   GLUCOSE 94 11/01/2021 1139   BUN 9 11/01/2021 1139   BUN 18 03/03/2020 0827   CREATININE 1.0 12/13/2021 0000   CREATININE 1.05 (H) 11/01/2021 1139   CREATININE 1.04 (H) 10/04/2021 1218   CREATININE 0.90 05/07/2016 1144   CALCIUM 9.5 11/01/2021 1139   PROT 7.7 10/04/2021 1218   PROT 7.3 03/03/2020 0827   ALBUMIN 4.2 11/08/2022 0000   ALBUMIN 4.6 03/03/2020 0827   AST 18 11/08/2022 0000   AST 17 10/04/2021 1218   ALT 19 11/08/2022 0000   ALT 13 10/04/2021 1218   ALKPHOS 97 11/08/2022 0000   BILITOT 0.5 10/04/2021 1218   GFRNONAA >60 11/01/2021 1139   GFRNONAA >60 10/04/2021 1218   GFRNONAA 72 05/07/2016 1144   GFRAA 77 03/03/2020 0827   GFRAA 83 05/07/2016 1144     Lab Results  Component Value Date   WBC 4.7 03/14/2022   HGB 13.9 03/14/2022   HCT 42 03/14/2022   MCV 93.0 01/10/2022   PLT 294 03/14/2022    Lab Results  Component Value Date   CHOL 125 11/08/2022   HDL 42 11/08/2022   LDLCALC 53 11/08/2022   TRIG 183 (A) 11/08/2022   CHOLHDL 3.3 03/03/2020    Lab Results  Component Value Date   HGBA1C 5.6 03/15/2022     Lab Results  Component Value Date   TSH 1.12 03/15/2022    Assessment & Plan:    PAP Smear: ***/***/*** was *** with repeat recommendation of ***  Mammogram: ***/***/*** was *** with repeat recommendation of ***  Colonoscopy: ***/***/*** was *** with repeat recommendation of ***  Bone DEXA: ***/***/*** was *** with repeat recommendation of ***  Vaccine Counseling: Due for ***; UTD on ***    Annual wellness visit done today including the all of  the following: Reviewed patient's Family Medical History Reviewed and updated list of patient's medical providers Assessment of cognitive impairment was done Assessed patient's functional ability Established a written schedule for health screening services Health Risk Assessent Completed and Reviewed  Discussed health benefits of physical activity, and encouraged her to engage in regular exercise appropriate for her age and condition.        I,Emily Lagle,acting as a Neurosurgeon for Margaree Mackintosh, MD.,have documented all relevant documentation on the behalf of Margaree Mackintosh, MD,as directed by  Margaree Mackintosh,  MD while in the presence of Margaree Mackintosh, MD.   ***

## 2023-06-17 NOTE — Progress Notes (Signed)
Patient Care Team: Margaree Mackintosh, MD as PCP - General (Internal Medicine) Abigail Miyamoto, MD as Consulting Physician (General Surgery) Serena Croissant, MD as Consulting Physician (Hematology and Oncology) Dorothy Puffer, MD as Consulting Physician (Radiation Oncology) Carmina Miller, MD as Consulting Physician (Radiation Oncology)  Visit Date: 06/17/23  Subjective:   Chief Complaint  Patient presents with   Cough   Nasal Congestion        Patient ZO:XWRUE C Dimon,Female DOB:[birth year],62 y.o. AVW:[id number]   63 y.o. Female presents today for acute sick visit with Cough and Nasal Congestion. Was supposed to have her annual exam today, however, in light of her symptoms today's appointment will be an office visit. She reports symptoms began Thursday with her worst symptoms being cough with expectoration of green mucus. Has been using Robitussin DM for her cough. Denies fever/chills, nausea or vomiting.   Reviewed her labs from 06/14/23 which raised no concerns.   In regards to her Obesity, we discussed if she had been able to get [facility]. She had been working on her exercise and has decided that it would be too much of a hassle currently. Recommended trying [facility] if she is agreeable.   Allergies  Allergen Reactions   Sulfa Antibiotics Rash and Hives    Past Medical History:  Diagnosis Date   Allergy    seasonal   Anxiety    Depression    Hypercholesteremia    Hypertension    Personal history of radiation therapy    PONV (postoperative nausea and vomiting)     Family History  Problem Relation Age of Onset   Hypertension Mother    Lung cancer Father 71   Hypertension Brother    Skin cancer Paternal Grandfather    Colon cancer Neg Hx    Breast cancer Neg Hx    Social History   Social History Narrative   Not on file   Review of Systems  Constitutional:  Negative for chills, fever and malaise/fatigue.  HENT:  Positive for congestion  (nasal).   Eyes:  Negative for blurred vision.  Respiratory:  Positive for cough (with expectoartion) and sputum production (green). Negative for shortness of breath.   Cardiovascular:  Negative for chest pain, palpitations and leg swelling.  Gastrointestinal:  Negative for nausea and vomiting.  Musculoskeletal:  Negative for back pain.  Skin:  Negative for rash.  Neurological:  Negative for loss of consciousness and headaches.     Objective:  Vitals: BP 130/80   Pulse 94   Temp 98.3 F (36.8 C)   Ht 5\' 3"  (1.6 m)   SpO2 93%   BMI 44.20 kg/m   Physical Exam Vitals and nursing note reviewed.  Constitutional:      General: She is not in acute distress.    Appearance: Normal appearance. She is not toxic-appearing.  HENT:     Head: Normocephalic and atraumatic.     Ears:     Comments: Right ear pink with a little fluid Left ear more fluid than right with a splayed light reflex Pulmonary:     Effort: Pulmonary effort is normal.  Skin:    General: Skin is warm and dry.  Neurological:     Mental Status: She is alert and oriented to person, place, and time. Mental status is at baseline.  Psychiatric:        Mood and Affect: Mood normal.        Behavior: Behavior normal.  Thought Content: Thought content normal.        Judgment: Judgment normal.     Results:  Studies Obtained And Personally Reviewed By Me: Labs:     Component Value Date/Time   NA 142 11/01/2021 1139   NA 143 03/03/2020 0827   K 4.1 11/01/2021 1139   CL 108 11/01/2021 1139   CO2 28 11/01/2021 1139   GLUCOSE 94 11/01/2021 1139   BUN 9 11/01/2021 1139   BUN 18 03/03/2020 0827   CREATININE 1.0 12/13/2021 0000   CREATININE 1.05 (H) 11/01/2021 1139   CREATININE 1.04 (H) 10/04/2021 1218   CREATININE 0.90 05/07/2016 1144   CALCIUM 9.5 11/01/2021 1139   PROT 7.7 10/04/2021 1218   PROT 7.3 03/03/2020 0827   ALBUMIN 4.2 11/08/2022 0000   ALBUMIN 4.6 03/03/2020 0827   AST 18 11/08/2022 0000   AST 17  10/04/2021 1218   ALT 19 11/08/2022 0000   ALT 13 10/04/2021 1218   ALKPHOS 97 11/08/2022 0000   BILITOT 0.5 10/04/2021 1218   GFRNONAA >60 11/01/2021 1139   GFRNONAA >60 10/04/2021 1218   GFRNONAA 72 05/07/2016 1144   GFRAA 77 03/03/2020 0827   GFRAA 83 05/07/2016 1144    Lab Results  Component Value Date   WBC 4.7 03/14/2022   HGB 13.9 03/14/2022   HCT 42 03/14/2022   MCV 93.0 01/10/2022   PLT 294 03/14/2022   Lab Results  Component Value Date   CHOL 125 11/08/2022   HDL 42 11/08/2022   LDLCALC 53 11/08/2022   TRIG 183 (A) 11/08/2022   CHOLHDL 3.3 03/03/2020   Lab Results  Component Value Date   HGBA1C 5.6 03/15/2022    Lab Results  Component Value Date   TSH 1.12 03/15/2022    Results for orders placed or performed in visit on 06/17/23  POC COVID-19 BinaxNow  Result Value Ref Range   SARS Coronavirus 2 Ag Negative Negative  POCT Flu A & B Status  Result Value Ref Range   Influenza A, POC Negative Negative   Influenza B, POC Negative Negative   Assessment & Plan:  Acute Upper Respiratory Infection: Covid-19 and Flu NEGATIVE. Symptoms began Thursday 06/13/23 and she is still symptomatic. Sending in 250 mg Azithromycin - take 2 tablets on Day 1 and 1 tablet on Days 2-5.   Reviewed her labs from 06/14/23 which raised no concerns.   Obesity: discussed if she had been able to get [facility]. She had been working on her exercise and has decided that it would be too much of a hassle currently. Suggested trying [facility] if she is agreeable.   Reschedule CPE. Needs labs done either here or [facility].  I,Emily Lagle,acting as a scribe for Margaree Mackintosh, MD.,have documented all relevant documentation on the behalf of Margaree Mackintosh, MD,as directed by  Margaree Mackintosh, MD while in the presence of Margaree Mackintosh, MD.   I, Margaree Mackintosh, MD, have reviewed all documentation for this visit. The documentation on 06/19/23 for the exam, diagnosis, procedures, and  orders are all accurate and complete.

## 2023-06-19 ENCOUNTER — Encounter: Payer: Self-pay | Admitting: Internal Medicine

## 2023-06-19 ENCOUNTER — Other Ambulatory Visit: Payer: Self-pay | Admitting: Internal Medicine

## 2023-06-19 NOTE — Patient Instructions (Addendum)
Please take Zithromax Z-PAK 2 tabs day 1 followed by 1 tab days 2 through 5.  Offered cough preparation but she declined.  Obesity treatment discussed briefly.  Says she has been working on diet and exercise and his not pursued getting Wegovy.  Suggested [facility] as an option.  Needs to return for complete physical exam and needs to have fasting labs other done here or through [facility].  Flu and COVID-19 test were negative  Scheduled CPE February 2025.

## 2023-06-23 ENCOUNTER — Encounter: Payer: Self-pay | Admitting: Internal Medicine

## 2023-07-07 ENCOUNTER — Ambulatory Visit
Admission: RE | Admit: 2023-07-07 | Discharge: 2023-07-07 | Disposition: A | Discharge status: Home / Self Care | Payer: Self-pay | Source: Ambulatory Visit | Attending: Internal Medicine | Admitting: Internal Medicine

## 2023-07-07 VITALS — BP 133/81 | HR 91 | Temp 99.2°F | Resp 17

## 2023-07-07 DIAGNOSIS — R1011 Right upper quadrant pain: Secondary | ICD-10-CM | POA: Diagnosis not present

## 2023-07-07 NOTE — ED Triage Notes (Signed)
 Sx since Thursday night  RUQ pain with fever Fever broke yesterday. Feels like a splinter in her stomach. No diarrhea or nausea

## 2023-07-07 NOTE — ED Provider Notes (Signed)
 [facility]    CSN: [id number] Arrival date & time: 07/07/23  1231      History   Chief Complaint Chief Complaint  Patient presents with   Abdominal Pain    Severe abdominal rt upper quadrant  w/ fever of 101. - Entered by patient    HPI Terri Montes is a 62 y.o. female.   HPI  63 year old female with past medical history significant for hypertension, depression, hypercholesterolemia, and anxiety presents for evaluation of right upper quadrant abdominal pain that is worse with eating and started 3 days ago.  She was running a fever of 101.3 up until yesterday when her fever broke.  She denies any nausea, vomiting, or diarrhea but does endorse a decreased appetite.  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/02/2021   Keratoconus of both eyes 10/09/2013   Dependent edema 02/15/2013   Situational stress 08/16/2012   Anxiety state 07/28/2012   Carbuncles 07/15/2012   Essential hypertension, benign 07/15/2012   History of migraine headaches 01/22/2011    Past Surgical History:  Procedure Laterality Date   ABDOMINAL HYSTERECTOMY  03/28/2010   partial   BREAST BIOPSY Right 2002   benign   BREAST LUMPECTOMY     BREAST LUMPECTOMY WITH RADIOACTIVE SEED AND SENTINEL LYMPH NODE BIOPSY Right 11/02/2021   Procedure: RIGHT BREAST LUMPECTOMY WITH RADIOACTIVE SEED AND SENTINEL LYMPH NODE BIOPSY;  Surgeon: Vernetta Berg, MD;  Location: [facility];  Service: General;  Laterality: Right;   BREAST SURGERY Right 05/28/2000   benign breast biopsy   CESAREAN SECTION  04/27/1994   REPAIR PERONEAL TENDONS ANKLE Bilateral 2003, 2006   REPLACEMENT TOTAL KNEE  Left 2021    OB History   No obstetric history on file.      Home Medications    Prior to Admission medications   Medication Sig Start Date End Date Taking? Authorizing Provider  escitalopram  (LEXAPRO ) 20 MG tablet TAKE 1 TABLET BY MOUTH DAILY 06/19/23  Yes Baxley, Ronal PARAS, MD  olmesartan  (BENICAR ) 20 MG tablet TAKE 1 TABLET BY MOUTH DAILY 03/12/23  Yes Baxley, Ronal PARAS, MD  acetaminophen  (TYLENOL ) 325 MG tablet Take 650 mg by mouth every 6 (six) hours as needed for pain.    [provider]  ALPRAZolam  (XANAX ) 0.5 MG tablet Take 1 tablet (0.5 mg total) by mouth 2 (two) times daily as needed for anxiety. 03/13/21   Perri Ronal PARAS, MD  benzonatate  (TESSALON ) 100 MG capsule Take 1 capsule (100 mg total) by mouth 3 (three) times daily as needed for cough. 06/17/23   Perri Ronal PARAS, MD  furosemide  (LASIX ) 20 MG tablet TAKE ONE TABLET (20 MG) BY MOUTH EVERY DAY 08/09/22   Perri Ronal PARAS, MD  ketoconazole  (NIZORAL ) 2 % cream Apply 1 application topically daily. Patient taking differently: Apply 1 application  topically daily as needed (as needed). 05/12/21   Perri Ronal PARAS, MD  rosuvastatin  (CRESTOR ) 10 MG tablet TAKE 1 TABLET BY MOUTH  DAILY 03/28/22   Perri Ronal PARAS, MD    Family History Family History  Problem Relation Age of Onset   Hypertension Mother  Lung cancer Father 27   Hypertension Brother    Skin cancer Paternal Grandfather    Colon cancer Neg Hx    Breast cancer Neg Hx     Social History Social History   Tobacco Use   Smoking status: Never   Smokeless tobacco: Never  Vaping Use   Vaping status: Never Used  Substance Use Topics   Alcohol use: No    Alcohol/week: 0.0 standard drinks of alcohol   Drug use: No     Allergies   Sulfa antibiotics   Review of Systems Review of Systems  Constitutional:  Positive for appetite change and fever.  Gastrointestinal:  Positive for abdominal pain. Negative for diarrhea, nausea and vomiting.     Physical  Exam Triage Vital Signs ED Triage Vitals  Encounter Vitals Group     BP      Systolic BP Percentile      Diastolic BP Percentile      Pulse      Resp      Temp      Temp src      SpO2      Weight      Height      Head Circumference      Peak Flow      Pain Score      Pain Loc      Pain Education      Exclude from Growth Chart    No data found.  Updated Vital Signs BP 133/81 (BP Location: Right Arm)   Pulse 91   Temp 99.2 F (37.3 C) (Oral)   Resp 17   SpO2 93%   Visual Acuity Right Eye Distance:   Left Eye Distance:   Bilateral Distance:    Right Eye Near:   Left Eye Near:    Bilateral Near:     Physical Exam Vitals and nursing note reviewed.  Constitutional:      Appearance: Normal appearance. She is ill-appearing.  HENT:     Head: Normocephalic and atraumatic.  Cardiovascular:     Rate and Rhythm: Normal rate and regular rhythm.     Pulses: Normal pulses.     Heart sounds: Normal heart sounds. No murmur heard.    No friction rub. No gallop.  Pulmonary:     Effort: Pulmonary effort is normal.     Breath sounds: Normal breath sounds. No wheezing, rhonchi or rales.  Abdominal:     Palpations: Abdomen is soft.     Tenderness: There is abdominal tenderness. There is no guarding or rebound.     Comments: Positive Murphy sign  Skin:    General: Skin is warm and dry.     Capillary Refill: Capillary refill takes less than 2 seconds.     Findings: No rash.  Neurological:     General: No focal deficit present.     Mental Status: She is alert and oriented to person, place, and time.      UC Treatments / Results  Labs (all labs ordered are listed, but only abnormal results are displayed) Labs Reviewed - No data to display  EKG   Radiology No results found.  Procedures Procedures (including critical care time)  Medications Ordered in UC Medications - No data to display  Initial Impression / Assessment and Plan / UC Course  I have reviewed the  triage vital signs and the nursing notes.  Pertinent labs & imaging results that were available during my care of the patient were  reviewed by me and considered in my medical decision making (see chart for details).   Patient is a pleasant, though mildly ill-appearing, 63 year old female presenting for evaluation of 3 days worth of right upper quadrant abdominal pain that has been associated with fever.  She reports that the pain does increase with eating and she has not wanted to eat as a result.  On exam her abdomen is soft but she is tender in the right upper quadrant and she has a positive Murphy sign.  These physical exam findings are concerning for possible cholecystitis.  I have advised her that she needs to be seen in the emergency department as I am unable to perform any imaging of her abdomen and we do not have many surgeons here at the urgent care.  After discussion she is elected to go to [facility] via POV.   Final Clinical Impressions(s) / UC Diagnoses   Final diagnoses:  Right upper quadrant abdominal pain     Discharge Instructions      As we discussed, I am concerned that your pain is a result of an infection of your gallbladder.  You need to be evaluated in the emergency department.  Please go to [facility] for further evaluation.  Nothing to eat or drink until after you were evaluated in the ER.     ED Prescriptions   None    PDMP not reviewed this encounter.   Bernardino Ditch, NP 07/07/23 [phone]

## 2023-07-07 NOTE — Discharge Instructions (Addendum)
 As we discussed, I am concerned that your pain is a result of an infection of your gallbladder.  You need to be evaluated in the emergency department.  Please go to [facility] for further evaluation.  Nothing to eat or drink until after you were evaluated in the ER.

## 2023-07-07 NOTE — ED Notes (Signed)
 Patient is being discharged from the Urgent Care and sent to the Emergency Department via POV . Per Venetia Motto, NP, patient is in need of higher level of care due to Abdominal pain and fever. Patient is aware and verbalizes understanding of plan of care.  Vitals:   07/07/23 1249  BP: 133/81  Pulse: 91  Resp: 17  Temp: 99.2 F (37.3 C)  SpO2: 93%

## 2023-07-10 ENCOUNTER — Encounter: Payer: Self-pay | Admitting: Internal Medicine

## 2023-07-10 ENCOUNTER — Ambulatory Visit: Payer: Managed Care, Other (non HMO) | Admitting: Internal Medicine

## 2023-07-10 ENCOUNTER — Other Ambulatory Visit (HOSPITAL_COMMUNITY)
Admission: RE | Admit: 2023-07-10 | Discharge: 2023-07-10 | Disposition: A | Discharge status: Home / Self Care | Payer: Managed Care, Other (non HMO) | Source: Ambulatory Visit | Attending: Internal Medicine | Admitting: Internal Medicine

## 2023-07-10 VITALS — BP 120/80 | HR 81 | Ht 63.25 in | Wt 241.0 lb

## 2023-07-10 DIAGNOSIS — Z124 Encounter for screening for malignant neoplasm of cervix: Secondary | ICD-10-CM | POA: Diagnosis not present

## 2023-07-10 DIAGNOSIS — Z23 Encounter for immunization: Secondary | ICD-10-CM | POA: Diagnosis not present

## 2023-07-10 DIAGNOSIS — K5904 Chronic idiopathic constipation: Secondary | ICD-10-CM

## 2023-07-10 DIAGNOSIS — Z0000 Encounter for general adult medical examination without abnormal findings: Secondary | ICD-10-CM

## 2023-07-10 DIAGNOSIS — N2889 Other specified disorders of kidney and ureter: Secondary | ICD-10-CM

## 2023-07-10 DIAGNOSIS — R748 Abnormal levels of other serum enzymes: Secondary | ICD-10-CM | POA: Diagnosis not present

## 2023-07-10 DIAGNOSIS — I10 Essential (primary) hypertension: Secondary | ICD-10-CM

## 2023-07-10 DIAGNOSIS — E782 Mixed hyperlipidemia: Secondary | ICD-10-CM

## 2023-07-10 LAB — POCT URINALYSIS DIP (CLINITEK)
Bilirubin, UA: NEGATIVE
Blood, UA: NEGATIVE
Glucose, UA: NEGATIVE mg/dL
Ketones, POC UA: NEGATIVE mg/dL
Leukocytes, UA: NEGATIVE
Nitrite, UA: NEGATIVE
POC PROTEIN,UA: NEGATIVE
Spec Grav, UA: <=1.005 (ref 1.010–1.025)
Urobilinogen, UA: 0.2 U/dL
pH, UA: 6.5 (ref 5.0–8.0)

## 2023-07-10 NOTE — Progress Notes (Signed)
Complete physical exam  Patient: Terri Montes   DOB: 10/08/1960   63 y.o. Female  MRN: [id number]  Subjective:    63 year old Female, longstanding patient in this practice, presents for follow-up  after Emergency department visit at [facility] in [place] on 07/07/2023.  Also she is here for annual CPE and evaluation of medical issues.  Patient presented to ED at [facility] in  [place] on February 9th in the early afternoon with complaint of abdominal pain for 3 days. She stated abdominal pain was worsening.  Patient complained of right upper quadrant abdominal pain and fever.  Patient did not have vomiting or stool changes.  Urine specimen showed trace  blood and rare bacteria.  Lipase was elevated at 69.  Electrolytes were normal.  Ultrasound showed no gallstones.  Patient also has been having some issues with constipation.  CT of the abdomen and pelvis with IV contrast showed the gallbladder to be normal in appearance with no ductal dilatation.  Pancreas and spleen were normal as were the adrenal glands.  Patient had no hydronephrosis but had hypoattenuating left exophytic renal focus measuring 2.3 cm. This could not clearly be identified as a cystic lesion and other studies were suggested to help identify this abnormality. Uterus was absent (history of hysterectomy).  Colonic diverticulosis was noted.  Appendix was normal.  No free air or ascites noted.  There was a small fat-containing umbilical hernia.  No lymphadenopathy.  Patient is now here for ED follow-up as wll as health maintenance exam which was previously scheduled.   Most recent fall risk assessment:    11/12/2022   11:32 AM  Fall Risk   Falls in the past year? 0  Number falls in past yr: 0  Injury with Fall? 0  Risk for fall due to : No Fall Risks     Most recent depression screenings:    06/17/2023    3:05 PM 05/25/2022   11:45 AM  PHQ 2/9 Scores  PHQ - 2 Score 0 0       Patient Care Team: Margaree Mackintosh, MD as PCP - General (Internal Medicine) Abigail Miyamoto, MD as Consulting Physician (General Surgery) Serena Croissant, MD as Consulting Physician (Hematology and Oncology) Dorothy Puffer, MD as Consulting Physician (Radiation Oncology) Carmina Miller, MD as Consulting Physician (Radiation Oncology)      ROS feels bloated and constipated.Last colonoscopy was 2016 by Dr. Rhea Belton at [facility]. Have suggested Miralax daily. Also think she should consider GI consult.Referral will be placed.          Objective:     BP 120/80   Pulse 81   Ht 5' 3.25" (1.607 m)   Wt 241 lb (109.3 kg)   SpO2 95%   BMI 42.35 kg/m    Physical Exam   Skin warm and dry. No cervical adenopathy.  TMs and pharynx are clear.No thyromegaly. Chest is clear. Cor:RRR. Abdomen: No hepatosplenomegaly.  No masses.No tenderness.No LE pitting edema. Pelvic: NFEG;Pap taken. No masses no bimanual exam. Neuro: intact without focal deficits       Assessment & Plan:    Routine Health Maintenance and Physical Exam  Recent abdominal pain and constipation issues requiring ED evaluation with abdominal CT finding of exophytic mass which requires further evaluation. Have ordered abdominal MRI  to evaluate left 2.6 cm exophytic renal mass. Recommend  daily Miralax for constipation. Dr. Rhea Belton did colonoscopy 2016 with 10 year follow up recommended.  Hx of breast  cancer  Anxiety and depression  Essential HTN treated with Benicar  Hyperlipidemia-mixed --treated with statin  BMI 42  Hx of left knee arthroplasty in 2021  Hx of migraine headaches  Hx of C-section 1995  TAH without oophorectomy for fibroids in 2009.   Dependent edema-treated with diuretic    Immunization History  Administered Date(s) Administered   Influenza,inj,Quad PF,6+ Mos 04/03/2013, 04/13/2014, 03/10/2015, 05/08/2016, 02/04/2018, 03/02/2019, 03/08/2020, 03/13/2021   Influenza-Unspecified 04/26/2022, 04/10/2023   PFIZER(Purple  Top)SARS-COV-2 Vaccination 08/15/2019, 08/26/2019, 05/24/2020   Pneumococcal-Unspecified 04/05/2022   Tdap 07/07/2009, 07/15/2012, 11/12/2022    Health Maintenance  Topic Date Due   Pneumococcal Vaccine 57-82 Years old (1 of 2 - PCV) 04/10/1967   Zoster Vaccines- Shingrix (1 of 2) Never done   Cervical Cancer Screening (HPV/Pap Cotest)  10/31/2014   COVID-19 Vaccine (4 - 2024-25 season) 07/26/2023 (Originally 01/27/2023)   MAMMOGRAM  09/02/2024   Colonoscopy  05/11/2025   DTaP/Tdap/Td (4 - Td or Tdap) 11/11/2032   INFLUENZA VACCINE  Completed   HPV VACCINES  Aged Out   Hepatitis C Screening  Discontinued   HIV Screening  Discontinued    Discussed health benefits of physical activity, and encouraged her to engage in regular exercise appropriate for her age and condition.   Await approval of abdominal MRI. Consider GI evaluation for constipation issues.      Margaree Mackintosh, MD

## 2023-07-10 NOTE — Patient Instructions (Addendum)
Lipase was repeated today. Initial result could be an error.  Pap is pending. MRI ordered placed and referral team will work on approval. Try Miralax daily for constipation.Suggest GI consult with [facility]. Has seen Dr. Rhea Belton previously. Suggest follow up here in 4-6 weeks after these evaluations have been completed.

## 2023-07-11 LAB — LIPASE: Lipase: 64 U/L — ABNORMAL HIGH (ref 7–60)

## 2023-07-11 NOTE — Progress Notes (Signed)
Called [facility] and they are going to reach out to patient no referral neccessary.

## 2023-07-12 LAB — CYTOLOGY - PAP
Comment: NEGATIVE
Diagnosis: NEGATIVE
High risk HPV: NEGATIVE

## 2023-07-21 ENCOUNTER — Ambulatory Visit (HOSPITAL_COMMUNITY)
Admission: RE | Admit: 2023-07-21 | Discharge: 2023-07-21 | Disposition: A | Discharge status: Home / Self Care | Payer: Managed Care, Other (non HMO) | Source: Ambulatory Visit | Attending: Internal Medicine | Admitting: Internal Medicine

## 2023-07-21 DIAGNOSIS — N2889 Other specified disorders of kidney and ureter: Secondary | ICD-10-CM | POA: Diagnosis present

## 2023-07-21 MED ORDER — GADOBUTROL 1 MMOL/ML IV SOLN
10.0000 mL | Freq: Once | INTRAVENOUS | Status: AC | PRN
  Administered 2023-07-21: 10 mL via INTRAVENOUS

## 2023-07-28 IMAGING — MG MM DIGITAL SCREENING BILAT W/ TOMO AND CAD
8 series · 8 of 24 positions shown · non-contrast
Comparison: Previous exam(s).

CLINICAL DATA: Screening.

EXAM:
DIGITAL SCREENING BILATERAL MAMMOGRAM WITH TOMOSYNTHESIS AND CAD
TECHNIQUE: Bilateral screening digital craniocaudal and mediolateral oblique
mammograms were obtained. Bilateral screening digital breast
tomosynthesis was performed. The images were evaluated with
computer-aided detection.

[L CC synth-2D]
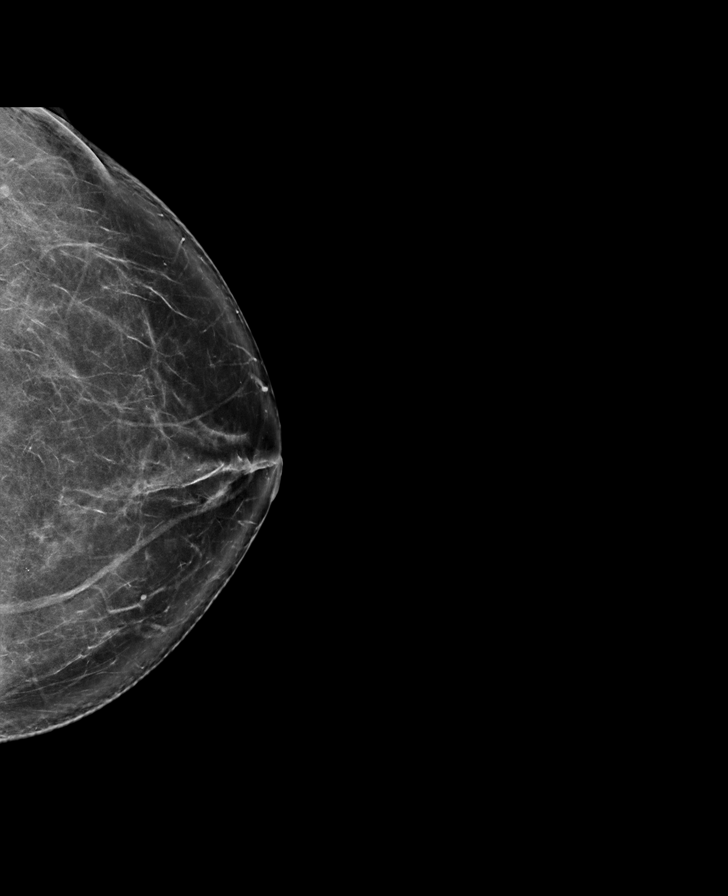

[R CC synth-2D]
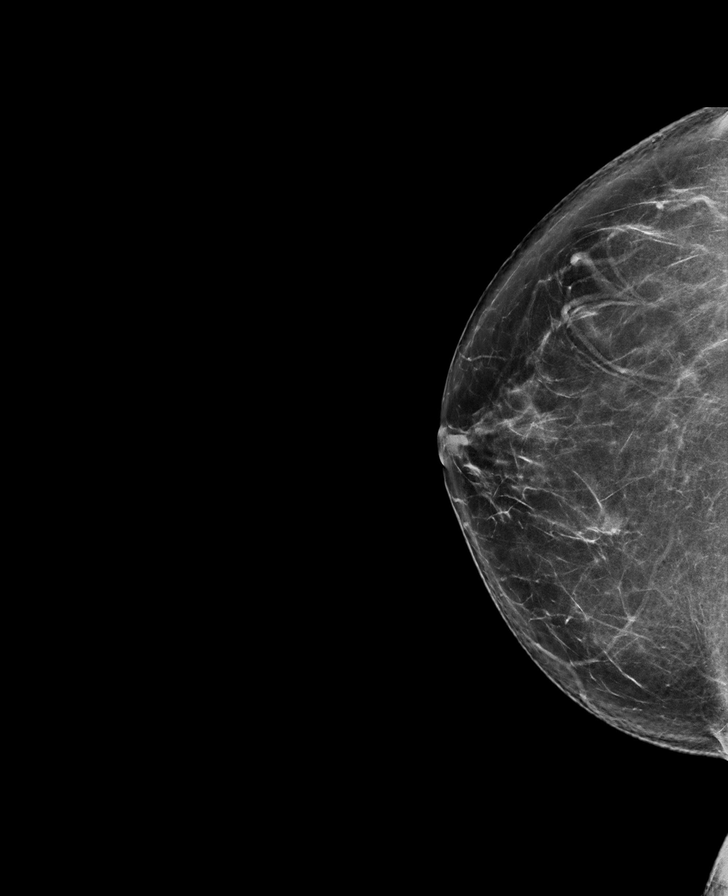

[L MLO synth-2D]
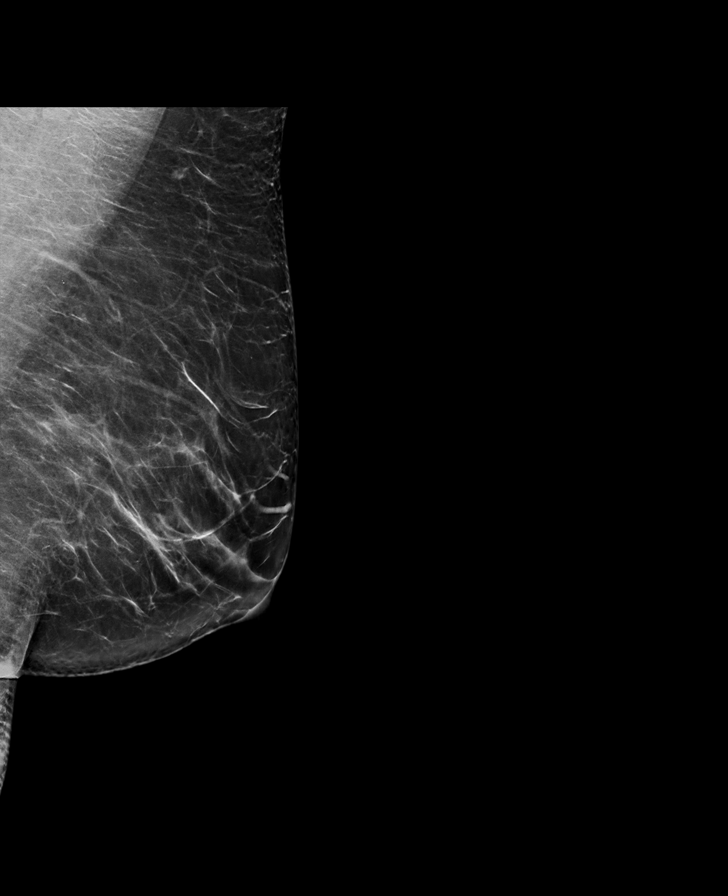

[R MLO synth-2D]
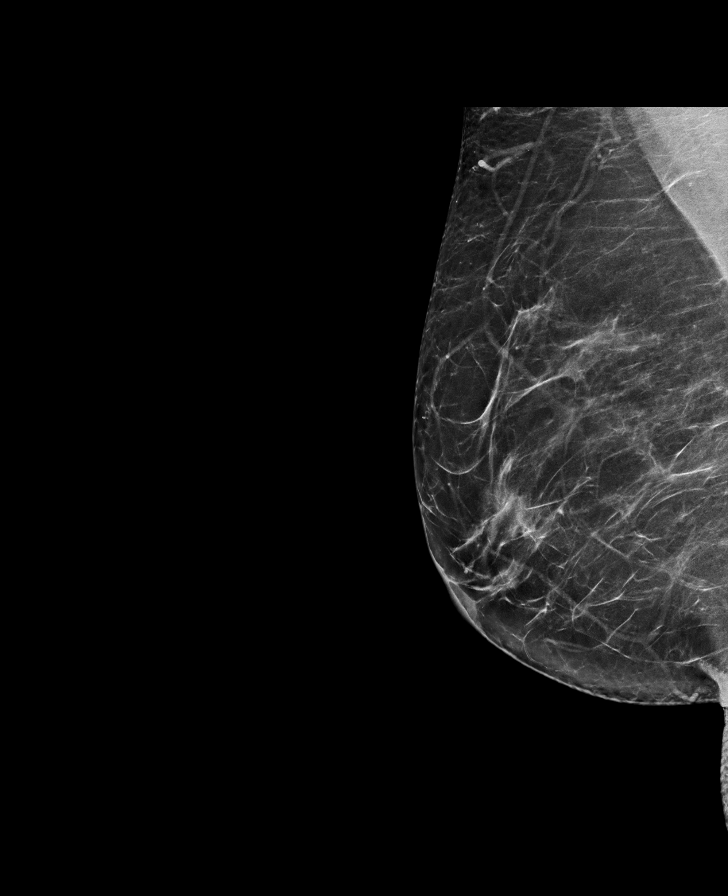

[L CC tomo · tomo slice 41/80.0]
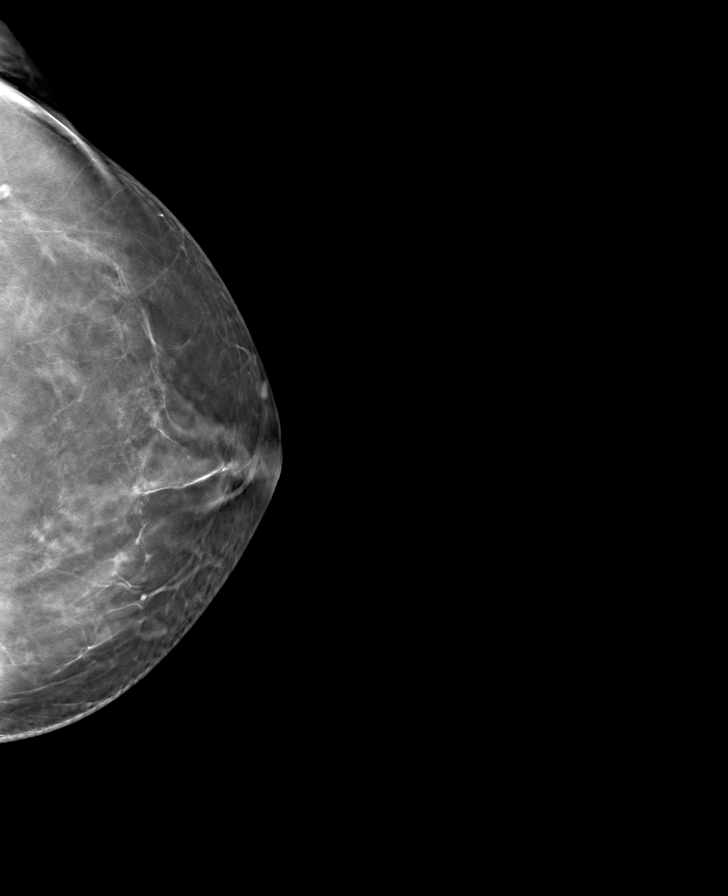

[L MLO tomo · tomo slice 43/84.0]
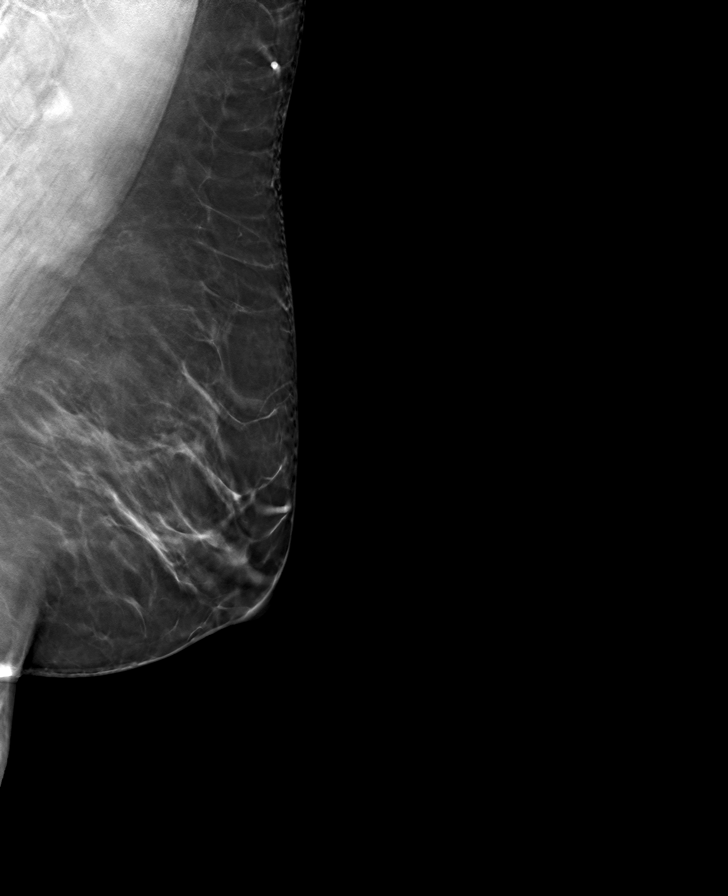

[R CC tomo · tomo slice 41/81.0]
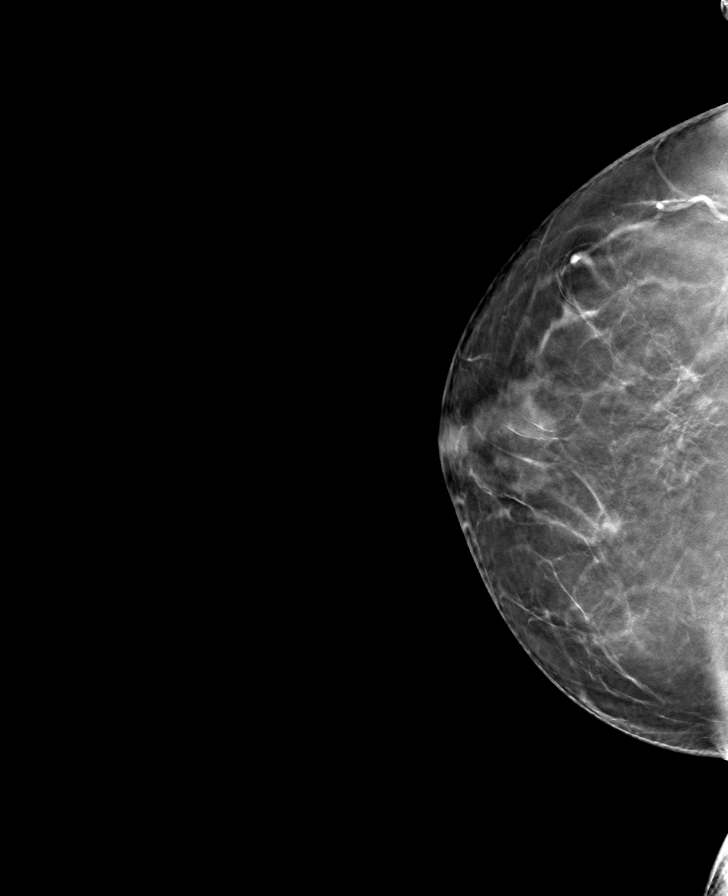

[R MLO tomo · tomo slice 40/79.0]
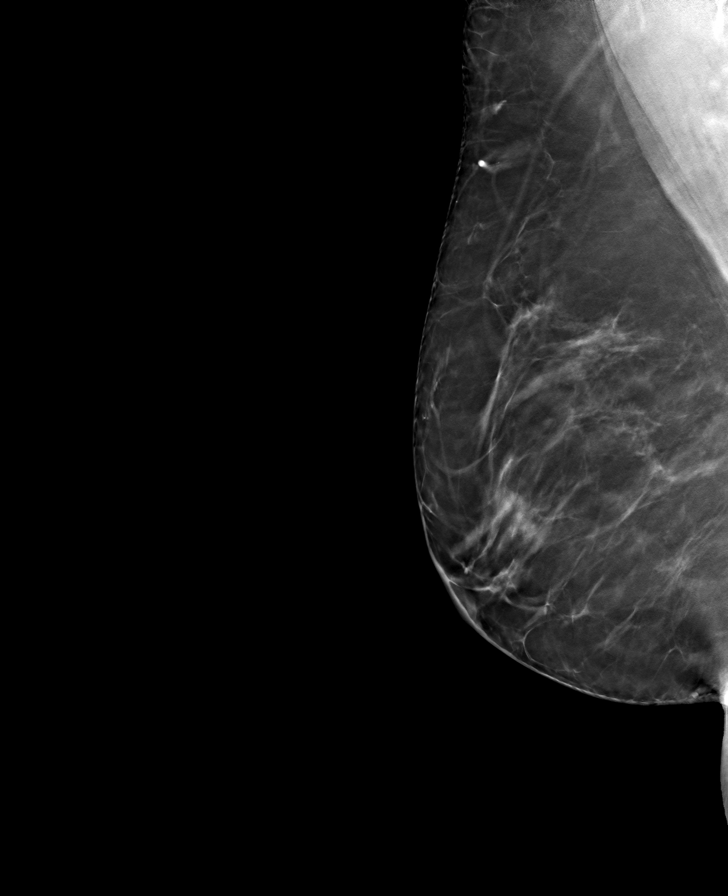

[8 of 24 positions shown; findings below may reference images not displayed]

ACR Breast Density Category b: There are scattered areas of
fibroglandular density.
FINDINGS: In the right breast, a possible asymmetry warrants further
evaluation. This possible asymmetry is seen within the upper inner
quadrant, at middle depth, cc slice 33 and MLO slice 28.

In the left breast, no findings suspicious for malignancy.
IMPRESSION: Further evaluation is suggested for possible asymmetry in the right
breast.

RECOMMENDATION:
Diagnostic mammogram and possibly ultrasound of the right breast.
(Code:[id number])

The patient will be contacted regarding the findings, and additional
imaging will be scheduled.

BI-RADS CATEGORY  0: Incomplete. Need additional imaging evaluation
and/or prior mammograms for comparison.

## 2023-07-31 ENCOUNTER — Other Ambulatory Visit: Payer: Self-pay | Admitting: Internal Medicine

## 2023-07-31 DIAGNOSIS — R92323 Mammographic fibroglandular density, bilateral breasts: Secondary | ICD-10-CM

## 2023-08-23 ENCOUNTER — Ambulatory Visit: Payer: Managed Care, Other (non HMO) | Admitting: Physician Assistant

## 2023-09-05 ENCOUNTER — Ambulatory Visit
Admission: RE | Admit: 2023-09-05 | Discharge: 2023-09-05 | Disposition: A | Discharge status: Home / Self Care | Source: Ambulatory Visit | Attending: Internal Medicine | Admitting: Internal Medicine

## 2023-09-05 DIAGNOSIS — R92323 Mammographic fibroglandular density, bilateral breasts: Secondary | ICD-10-CM

## 2024-01-02 ENCOUNTER — Encounter: Payer: Self-pay | Admitting: Internal Medicine

## 2024-01-03 ENCOUNTER — Encounter: Payer: Self-pay | Admitting: Internal Medicine

## 2024-01-06 ENCOUNTER — Telehealth: Payer: Self-pay | Admitting: Internal Medicine

## 2024-01-10 ENCOUNTER — Ambulatory Visit: Payer: Managed Care, Other (non HMO) | Admitting: Internal Medicine

## 2024-03-09 ENCOUNTER — Other Ambulatory Visit: Payer: Self-pay | Admitting: Internal Medicine

## 2024-05-11 NOTE — Telephone Encounter (Signed)
 done

## 2024-05-29 ENCOUNTER — Other Ambulatory Visit: Payer: Self-pay | Admitting: Internal Medicine

## 2024-06-30 NOTE — Progress Notes (Incomplete)
 "  Annual Wellness Visit   Patient Care Team: Baxley, Ronal PARAS, MD as PCP - General (Internal Medicine) Vernetta Berg, MD as Consulting Physician (General Surgery) Odean Potts, MD as Consulting Physician (Hematology and Oncology) Dewey Rush, MD as Consulting Physician (Radiation Oncology) Lenn Aran, MD as Consulting Physician (Radiation Oncology)  Visit Date: 06/30/24   No chief complaint on file.  Subjective:  Patient: Terri Montes, Female DOB: 1960/06/24, 64 y.o. MRN: [id number] There were no vitals filed for this visit. Terri Montes is a 64 y.o. Female who presents today for her Annual Comprehensive Physical Exam. 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  History of Anxiety of Depression   History of Essential Hypertension treated with Benicar    History of hyperlipidemia treated with statin   History of Left knee arthroplasty in 2021   History of Migraine headaches  History of C-section 1995   TAH without oophorectomy for fibroids in 2009.    Dependent edema-treated with diuretic  Labs ***/***/*** {Labs (Optional):31667}   09/05/2023 Mammogram No findings suspicious for breast malignancy.  Post lumpectomy changes again seen in the upper inner right breast. Repeat in one year.    05/12/2015 Colonoscopy mild diverticulosis was noted in the sigmoid colon, the examination was otherwise WNL.  Repeat in 10 years.  Health Maintenance  Topic Date Due   Zoster Vaccines- Shingrix (1 of 2) Never done   COVID-19 Vaccine (4 - 2025-26 season) 01/27/2024   Mammogram  09/04/2024   Colonoscopy  05/11/2025   Cervical Cancer Screening (HPV/Pap Cotest)  07/09/2028   DTaP/Tdap/Td (4 - Td or Tdap) 11/11/2032   Pneumococcal Vaccine: 50+ Years  Completed    Influenza Vaccine  Completed   HPV VACCINES (No Doses Required) Completed   Hepatitis B Vaccines 19-59 Average Risk  Aged Out   Meningococcal B Vaccine  Aged Out   Hepatitis C Screening  Discontinued   HIV Screening  Discontinued    {Man or Woman:32389}  Vaccine Counseling: Due for {Vaccines:32291::Influenza}; UTD on {Vaccines:32291::Influenza}  ROS Objective:  Vitals: body mass index is unknown because there is no height or weight on file.There were no vitals filed for this visit. Physical Exam  Current Outpatient Medications  Medication Instructions   acetaminophen  (TYLENOL ) 650 mg, Every 6 hours PRN   ALPRAZolam  (XANAX ) 0.5 mg, Oral, 2 times daily PRN   escitalopram  (LEXAPRO ) 20 mg, Oral, Daily   ketoconazole  (NIZORAL ) 2 % cream 1 application , Topical, Daily   olmesartan  (BENICAR ) 20 mg, Oral, Daily   rosuvastatin  (CRESTOR ) 10 mg, Oral, Daily   Past Medical History:  Diagnosis Date   Allergy    seasonal   Anxiety    Depression    Hypercholesteremia    Hypertension    Personal history of radiation therapy    PONV (postoperative nausea and vomiting)    Medical/Surgical History Narrative:  Allergic/Intolerant to: Allergies[1] *** - ***  *** - ***  *** - ***  *** - ***  *** - ***  *** - ***  *** - ***  *** - *** Other - Hx of: *** ; Surghx of: *** Past Surgical History:  Procedure Laterality Date   ABDOMINAL HYSTERECTOMY  03/28/2010   partial   BREAST BIOPSY Right 2002   benign   BREAST LUMPECTOMY     BREAST LUMPECTOMY WITH RADIOACTIVE  SEED AND SENTINEL LYMPH NODE BIOPSY Right 11/02/2021   Procedure: RIGHT BREAST LUMPECTOMY WITH RADIOACTIVE SEED AND SENTINEL LYMPH NODE BIOPSY;  Surgeon: Vernetta Berg, MD;  Location:  SUR[facility]ERY CENTER;  Service: General;  Laterality: Right;   BREAST SURGERY Right 05/28/2000   benign breast biopsy   CESAREAN SECTION  04/27/1994   REPAIR PERONEAL TENDONS ANKLE Bilateral 2003, 2006   REPLACEMENT TOTAL  KNEE Left 2021   Family History  Problem Relation Age of Onset   Hypertension Mother    Lung cancer Father 68   Hypertension Brother    Skin cancer Paternal Grandfather    Colon cancer Neg Hx    Breast cancer Neg Hx    Family History Narrative: {ELFamHX:31110} Social History   Social History Narrative   Not on file   Most Recent Health Risks Assessment:   Most Recent Social Determinants of Health (Including Hx of Tobacco, Alcohol, and Drug Use) SDOH Screenings   Food Insecurity: No Food Insecurity (07/08/2023)  Housing: Low Risk (07/08/2023)  Transportation Needs: No Transportation Needs (07/08/2023)  Alcohol Screen: Low Risk (07/08/2023)  Depression (PHQ2-9): Low Risk (06/17/2023)  Financial Resource Strain: Low Risk (07/08/2023)  Physical Activity: Insufficiently Active (07/08/2023)  Social Connections: Moderately Isolated (07/08/2023)  Stress: Stress Concern Present (07/08/2023)  Tobacco Use: Low Risk (07/10/2023)   Social History[2] Most Recent Functional Status Assessment:     No data to display         Most Recent Fall Risk Assessment:    11/12/2022   11:32 AM  Fall Risk   Falls in the past year? 0  Number falls in past yr: 0  Injury with Fall? 0   Risk for fall due to : No Fall Risks     Data saved with a previous flowsheet row definition   Most Recent Anxiety/Depression Screenings:    06/17/2023    3:05 PM 05/25/2022   11:45 AM  PHQ 2/9 Scores  PHQ - 2 Score 0 0       No data to display         Most Recent Cognitive Screening:     No data to display         Most Recent Vision/Hearing Screenings:No results found. Results:  Studies Obtained And Personally Reviewed By Me: Diabetic Foot Exam - Simple   No data filed     {Imaging, colonoscopy, mammogram, bone density scan, echocardiogram, heart cath, stress test, CT calcium  score, etc.:32292}  Labs:  CBC w/ Differential Lab Results  Component Value Date   WBC 4.7 03/14/2022   RBC 4.50  03/15/2022   HGB 13.9 03/14/2022   HCT 42 03/14/2022   PLT 294 03/14/2022   MCV 93.0 01/10/2022   MCH 31.4 01/10/2022   MCHC 33.8 01/10/2022   RDW 12.6 01/10/2022   MPV 9.6 05/07/2016   LYMPHSABS 2.0 10/04/2021   MONOABS 0.6 10/04/2021   BASOSABS 0.1 10/04/2021    Comprehensive Metabolic Panel Lab Results  Component Value Date   NA 142 11/01/2021   K 4.1 11/01/2021   CL 108 11/01/2021   CO2 28 11/01/2021   GLUCOSE 94 11/01/2021   BUN 9 11/01/2021   CREATININE 1.0 12/13/2021   CALCIUM  9.5 11/01/2021   PROT 7.7 10/04/2021   ALBUMIN 4.2 11/08/2022   AST 18 11/08/2022   ALT 19 11/08/2022   ALKPHOS 97 11/08/2022   BILITOT 0.5 10/04/2021   EGFR 69 12/13/2021   GFRNONAA >60 11/01/2021   Lipid Panel  Lab Results  Component Value Date   CHOL 125 11/08/2022   HDL 42 11/08/2022   LDLCALC 53 11/08/2022   TRIG 183 (A) 11/08/2022   A1c Lab Results  Component Value Date   HGBA1C 5.6 03/15/2022    TSH Lab Results  Component Value Date   TSH 1.12 03/15/2022   PSA{PSA (Optional):32132} No results found for any visits on 07/13/24. Assessment & Plan:  No orders of the defined types were placed in this encounter.  No orders of the defined types were placed in this encounter.  Other Labs Reviewed today:    No follow-ups on file.   Annual Comprehensive Physical Exam done today including the all of the following: Reviewed patient's Family Medical History Reviewed patient's SDOH and reviewed tobacco, alcohol, and drug use.  Reviewed and updated list of patient's medical providers Assessment of cognitive impairment was done Assessed patient's functional ability Established a written schedule for health screening services Health Risk Assessent Completed and Reviewed  Discussed health benefits of physical activity, and encouraged her to engage in regular exercise appropriate for her age and condition.   I,Makayla C Reid,acting as a scribe for Ronal JINNY Hailstone, MD.,have  documented all relevant documentation on the behalf of Ronal JINNY Hailstone, MD,as directed by  Ronal JINNY Hailstone, MD while in the presence of Ronal JINNY Hailstone, MD.  I, Ronal JINNY Hailstone, MD, have reviewed all documentation for and agree with the above Annual Wellness Visit documentation.  Ronal JINNY Hailstone, MD Internal Medicine 07/13/2024    [1]  Allergies Allergen Reactions   Sulfa Antibiotics Rash and Hives  [2]  Social History Tobacco Use   Smoking status: Never   Smokeless tobacco: Never  Vaping Use   Vaping status: Never Used  Substance Use Topics   Alcohol use: No    Alcohol/week: 0.0 standard drinks of alcohol   Drug use: No   "

## 2024-07-13 ENCOUNTER — Encounter: Payer: Self-pay | Admitting: Internal Medicine
